# Patient Record
Sex: Male | Born: 1950 | Race: White | Hispanic: No | Marital: Married | Smoking: Former smoker
Health system: Southern US, Community
[De-identification: ages and names within clinical notes are randomized; demographics above are authoritative.]

## PROBLEM LIST (undated history)

## (undated) DIAGNOSIS — E559 Vitamin D deficiency, unspecified: Secondary | ICD-10-CM

## (undated) DIAGNOSIS — I77811 Abdominal aortic ectasia: Secondary | ICD-10-CM

## (undated) DIAGNOSIS — N529 Male erectile dysfunction, unspecified: Secondary | ICD-10-CM

## (undated) DIAGNOSIS — K759 Inflammatory liver disease, unspecified: Secondary | ICD-10-CM

## (undated) DIAGNOSIS — J449 Chronic obstructive pulmonary disease, unspecified: Secondary | ICD-10-CM

## (undated) DIAGNOSIS — Z87442 Personal history of urinary calculi: Secondary | ICD-10-CM

## (undated) DIAGNOSIS — E291 Testicular hypofunction: Secondary | ICD-10-CM

## (undated) DIAGNOSIS — E785 Hyperlipidemia, unspecified: Secondary | ICD-10-CM

## (undated) DIAGNOSIS — Z8719 Personal history of other diseases of the digestive system: Secondary | ICD-10-CM

## (undated) DIAGNOSIS — Z8619 Personal history of other infectious and parasitic diseases: Secondary | ICD-10-CM

## (undated) DIAGNOSIS — F17200 Nicotine dependence, unspecified, uncomplicated: Secondary | ICD-10-CM

## (undated) HISTORY — DX: Nicotine dependence, unspecified, uncomplicated: F17.200

## (undated) HISTORY — DX: Testicular hypofunction: E29.1

## (undated) HISTORY — DX: Male erectile dysfunction, unspecified: N52.9

## (undated) HISTORY — DX: Personal history of other diseases of the digestive system: Z87.19

## (undated) HISTORY — DX: Chronic obstructive pulmonary disease, unspecified: J44.9

## (undated) HISTORY — DX: Personal history of other infectious and parasitic diseases: Z86.19

## (undated) HISTORY — DX: Hyperlipidemia, unspecified: E78.5

## (undated) HISTORY — DX: Vitamin D deficiency, unspecified: E55.9

## (undated) HISTORY — DX: Abdominal aortic ectasia: I77.811

## (undated) HISTORY — DX: Personal history of urinary calculi: Z87.442

---

## 1982-11-07 DIAGNOSIS — K759 Inflammatory liver disease, unspecified: Secondary | ICD-10-CM

## 1982-11-07 DIAGNOSIS — Z8619 Personal history of other infectious and parasitic diseases: Secondary | ICD-10-CM

## 1982-11-07 HISTORY — DX: Inflammatory liver disease, unspecified: K75.9

## 1982-11-07 HISTORY — DX: Personal history of other infectious and parasitic diseases: Z86.19

## 1991-11-08 HISTORY — PX: OTHER SURGICAL HISTORY: SHX169

## 2001-11-07 HISTORY — PX: PILONIDAL CYST EXCISION: SHX744

## 2005-04-20 ENCOUNTER — Ambulatory Visit: Payer: Self-pay | Admitting: Vascular Surgery

## 2007-09-08 HISTORY — PX: COLONOSCOPY: SHX174

## 2007-09-08 HISTORY — PX: ESOPHAGOGASTRODUODENOSCOPY: SHX1529

## 2007-10-02 ENCOUNTER — Ambulatory Visit: Payer: Self-pay | Admitting: Gastroenterology

## 2008-11-07 HISTORY — PX: CARDIOVASCULAR STRESS TEST: SHX262

## 2009-10-20 DIAGNOSIS — Z86018 Personal history of other benign neoplasm: Secondary | ICD-10-CM

## 2009-10-20 HISTORY — DX: Personal history of other benign neoplasm: Z86.018

## 2011-10-21 LAB — LIPID PANEL
HDL: 34 mg/dL — AB (ref 35–70)
Triglycerides: 235

## 2011-10-21 LAB — HEPATIC FUNCTION PANEL
AST: 20 U/L
Alkaline Phosphatase: 118 U/L
Total Bilirubin: 0.4 mg/dL

## 2011-10-21 LAB — CBC
WBC: 7
platelet count: 233

## 2011-10-21 LAB — TESTOSTERONE: Testosterone: 303

## 2011-10-21 LAB — BASIC METABOLIC PANEL: Creat: 0.9

## 2012-01-06 HISTORY — PX: LITHOTRIPSY: SUR834

## 2012-01-20 ENCOUNTER — Inpatient Hospital Stay: Payer: Self-pay | Admitting: Student

## 2012-01-20 LAB — URINALYSIS, COMPLETE
Bacteria: NONE SEEN
Leukocyte Esterase: NEGATIVE
Protein: 100
RBC,UR: 175 /HPF (ref 0–5)
Specific Gravity: 1.038 (ref 1.003–1.030)
Squamous Epithelial: NONE SEEN
WBC UR: 8 /HPF (ref 0–5)

## 2012-01-20 LAB — CBC
HCT: 45.4 % (ref 40.0–52.0)
HGB: 15.4 g/dL (ref 13.0–18.0)
MCH: 31.7 pg (ref 26.0–34.0)
MCHC: 33.9 g/dL (ref 32.0–36.0)
MCV: 94 fL (ref 80–100)
RDW: 14.4 % (ref 11.5–14.5)

## 2012-01-20 LAB — BASIC METABOLIC PANEL
Anion Gap: 10 (ref 7–16)
Calcium, Total: 8.8 mg/dL (ref 8.5–10.1)
Co2: 25 mmol/L (ref 21–32)
EGFR (Non-African Amer.): >60
Glucose: 123 mg/dL — ABNORMAL HIGH (ref 65–99)
Osmolality: 279 (ref 275–301)
Potassium: 4.1 mmol/L (ref 3.5–5.1)
Sodium: 139 mmol/L (ref 136–145)

## 2012-01-21 ENCOUNTER — Ambulatory Visit: Payer: Self-pay | Admitting: Urology

## 2012-01-21 LAB — BASIC METABOLIC PANEL
Calcium, Total: 7.7 mg/dL — ABNORMAL LOW (ref 8.5–10.1)
Chloride: 109 mmol/L — ABNORMAL HIGH (ref 98–107)
Co2: 22 mmol/L (ref 21–32)
Creatinine: 1.37 mg/dL — ABNORMAL HIGH (ref 0.60–1.30)
EGFR (African American): >60
Osmolality: 285 (ref 275–301)
Potassium: 4.1 mmol/L (ref 3.5–5.1)
Sodium: 142 mmol/L (ref 136–145)

## 2012-01-21 LAB — CBC WITH DIFFERENTIAL/PLATELET
Basophil #: 0 10*3/uL (ref 0.0–0.1)
Basophil %: 0.2 %
Eosinophil %: 1.1 %
HCT: 39.6 % — ABNORMAL LOW (ref 40.0–52.0)
Lymphocyte #: 1.5 10*3/uL (ref 1.0–3.6)
MCH: 31.2 pg (ref 26.0–34.0)
MCV: 94 fL (ref 80–100)
Monocyte #: 1 10*3/uL — ABNORMAL HIGH (ref 0.0–0.7)
Monocyte %: 8.7 %
Neutrophil #: 9.3 10*3/uL — ABNORMAL HIGH (ref 1.4–6.5)
Platelet: 166 10*3/uL (ref 150–440)
RDW: 14.2 % (ref 11.5–14.5)
WBC: 12 10*3/uL — ABNORMAL HIGH (ref 3.8–10.6)

## 2012-01-21 LAB — MAGNESIUM: Magnesium: 1.7 mg/dL — ABNORMAL LOW

## 2012-01-22 LAB — BASIC METABOLIC PANEL
Anion Gap: 6 — ABNORMAL LOW (ref 7–16)
Calcium, Total: 7.8 mg/dL — ABNORMAL LOW (ref 8.5–10.1)
Chloride: 110 mmol/L — ABNORMAL HIGH (ref 98–107)
EGFR (African American): >60
EGFR (Non-African Amer.): >60
Glucose: 89 mg/dL (ref 65–99)
Osmolality: 280 (ref 275–301)
Potassium: 4 mmol/L (ref 3.5–5.1)

## 2012-01-26 ENCOUNTER — Ambulatory Visit: Payer: Self-pay | Admitting: Urology

## 2012-04-10 ENCOUNTER — Ambulatory Visit (INDEPENDENT_AMBULATORY_CARE_PROVIDER_SITE_OTHER): Payer: 59 | Admitting: Family Medicine

## 2012-04-10 ENCOUNTER — Encounter: Payer: Self-pay | Admitting: Family Medicine

## 2012-04-10 VITALS — BP 126/82 | HR 76 | Temp 98.4°F | Ht 68.0 in | Wt 179.2 lb

## 2012-04-10 DIAGNOSIS — F17200 Nicotine dependence, unspecified, uncomplicated: Secondary | ICD-10-CM

## 2012-04-10 DIAGNOSIS — E291 Testicular hypofunction: Secondary | ICD-10-CM | POA: Insufficient documentation

## 2012-04-10 DIAGNOSIS — Z87442 Personal history of urinary calculi: Secondary | ICD-10-CM

## 2012-04-10 DIAGNOSIS — N529 Male erectile dysfunction, unspecified: Secondary | ICD-10-CM | POA: Insufficient documentation

## 2012-04-10 MED ORDER — TADALAFIL 20 MG PO TABS: 10.0000 mg | ORAL_TABLET | Freq: Every day | ORAL | Status: DC | PRN

## 2012-04-10 NOTE — Assessment & Plan Note (Signed)
Sent in cialis again.  Has tolerated in past well.

## 2012-04-10 NOTE — Patient Instructions (Signed)
Good to meet you today, call us with questions. Return at end of year for physical, a few days prior fasting for blood work. cialis sent to pharmacy. Strongly consider cutting back if not quitting smoking - best thing you can do for your health. Let me know if worsening shortness of breath or cough.

## 2012-04-10 NOTE — Progress Notes (Signed)
Subjective:    Patient ID: Andrew Mccarthy, male    DOB: 1951/05/10, 61 y.o.   MRN: [id number]  HPI CC: new pt, establish  Prior saw Andrew Mccarthy, who retired.  Smoking - 2 ppd.  62 PY hx.  Not currently interested in quitting.  Enjoys smoking.  Works at [employer].  H/o hypogonadism - followed by Andrew Mccarthy, had received testopel but doesn't want to do pellets anymore.  Recent hematuria - normal workup.  H/o kidney stones - needed removed latest one 4mm (01/2012).  Some ED - treated with cialis in past by uro and responded well to this.  States no h/o HLD, HTN, DM.  Caffeine: 2 cups coffee/day, 4-5 mountain dew/day. Lives with wife Andrew Mccarthy) Occupation: Music therapist, maintenance with [employer] Edu: HS Activity: no regular exercise, stays active at job Diet: fruits/vegetables occasionally, lots of mountain dew  Preventative: Last CPE unsure, thinks 10/2011. Tetanus unsure, requests at next CPE. colonosocpy 2008 - told normal, rpt 10 yrs.  Done with [facility] clinic. Prostate screening - yearly.  No problems in past.  Medications and allergies reviewed and updated in chart.  Past histories reviewed and updated if relevant as below. There is no problem list on file for this patient.  Past Medical History  Diagnosis Date  . GERD (gastroesophageal reflux disease)   . 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 History of kidney stones last 2013  . Hypogonadism male     Andrew Mccarthy, uro  . Smoker   . ED (erectile dysfunction)    Past Surgical History  Procedure Date  . Kidney stone retraction 1993  . Lithotripsy 01/2012  . Esophagogastroduodenoscopy 09/2007    WNL per pt  . Colonoscopy 09/2007    WNL per pt, rpt 10 yrs  . Pilonidal cyst excision 2003   History  Substance Use Topics  . Smoking status: Current Everyday Smoker -- 2.0 packs/day for 30 years    Types: Cigarettes  . Smokeless tobacco: Never Used  . Alcohol Use: No   Family History  Problem Relation Age of Onset  .  Cancer Father 26    prostate  . Cancer Sister     breast  . Rheum arthritis Sister   . Diabetes Neg Hx   . Coronary artery disease Neg Hx   . Stroke Neg Hx    No Known Allergies Current Outpatient Prescriptions on File Prior to Visit  Medication Sig Dispense Refill  . Testosterone (TESTOPEL) 75 MG PLLT by Implant route as directed.         Review of Systems  Constitutional: Negative for fever, chills, activity change, appetite change, fatigue and unexpected weight change.  HENT: Negative for hearing loss and neck pain.   Eyes: Negative for visual disturbance.  Respiratory: Positive for cough. Negative for chest tightness, shortness of breath and wheezing.   Cardiovascular: Negative for chest pain, palpitations and leg swelling.  Gastrointestinal: Negative for nausea, vomiting, abdominal pain, diarrhea, constipation, blood in stool and abdominal distention.  Genitourinary: Negative for hematuria and difficulty urinating.  Musculoskeletal: Negative for myalgias and arthralgias.  Skin: Negative for rash.  Neurological: Negative for dizziness, seizures, syncope and headaches.  Hematological: Does not bruise/bleed easily.  Psychiatric/Behavioral: Negative for dysphoric mood. The patient is not nervous/anxious.        Objective:   Physical Exam  Nursing note and vitals reviewed. Constitutional: He is oriented to person, place, and time. He appears well-developed and well-nourished. No distress.  HENT:  Head: Normocephalic  and atraumatic.  Right Ear: Hearing, tympanic membrane, external ear and ear canal normal.  Left Ear: Hearing, tympanic membrane, external ear and ear canal normal.  Nose: Nose normal. No mucosal edema or rhinorrhea.  Mouth/Throat: Uvula is midline, oropharynx is clear and moist and mucous membranes are normal. No oropharyngeal exudate, posterior oropharyngeal edema, posterior oropharyngeal erythema or tonsillar abscesses.  Eyes: Conjunctivae and EOM are normal.  Pupils are equal, round, and reactive to light. No scleral icterus.  Neck: Normal range of motion. Neck supple. No thyromegaly present.  Cardiovascular: Normal rate, regular rhythm, normal heart sounds and intact distal pulses.   No murmur heard. Pulses:      Radial pulses are 2+ on the right side, and 2+ on the left side.  Pulmonary/Chest: Effort normal and breath sounds normal. No respiratory distress. He has no wheezes. He has no rales.  Abdominal: Soft. Bowel sounds are normal. He exhibits no distension and no mass. There is no tenderness. There is no rebound and no guarding.  Musculoskeletal: Normal range of motion. He exhibits no edema.  Lymphadenopathy:    He has no cervical adenopathy.  Neurological: He is alert and oriented to person, place, and time.       CN grossly intact, station and gait intact  Skin: Skin is warm and dry. No rash noted.  Psychiatric: He has a normal mood and affect. His behavior is normal. Judgment and thought content normal.       Assessment & Plan:

## 2012-04-10 NOTE — Assessment & Plan Note (Signed)
Followed by urology.   

## 2012-04-10 NOTE — Assessment & Plan Note (Signed)
Discussed importance of smoking cessation Discussed concern with developing COPD with such long smoking history Currently precontemplative.  Will continue to assess desire for cessation.

## 2012-09-02 ENCOUNTER — Encounter: Payer: Self-pay | Admitting: Family Medicine

## 2012-09-02 DIAGNOSIS — E559 Vitamin D deficiency, unspecified: Secondary | ICD-10-CM | POA: Insufficient documentation

## 2012-09-05 ENCOUNTER — Encounter: Payer: Self-pay | Admitting: Family Medicine

## 2012-10-15 ENCOUNTER — Ambulatory Visit (INDEPENDENT_AMBULATORY_CARE_PROVIDER_SITE_OTHER): Payer: 59 | Admitting: Family Medicine

## 2012-10-15 ENCOUNTER — Encounter: Payer: Self-pay | Admitting: Family Medicine

## 2012-10-15 VITALS — BP 100/62 | HR 60 | Temp 98.4°F | Ht 68.25 in | Wt 175.2 lb

## 2012-10-15 DIAGNOSIS — Z125 Encounter for screening for malignant neoplasm of prostate: Secondary | ICD-10-CM

## 2012-10-15 DIAGNOSIS — Z0000 Encounter for general adult medical examination without abnormal findings: Secondary | ICD-10-CM

## 2012-10-15 DIAGNOSIS — F17200 Nicotine dependence, unspecified, uncomplicated: Secondary | ICD-10-CM

## 2012-10-15 DIAGNOSIS — Z23 Encounter for immunization: Secondary | ICD-10-CM

## 2012-10-15 LAB — LIPID PANEL
HDL: 35.6 mg/dL — ABNORMAL LOW (ref >39.00)
LDL Cholesterol: 74 mg/dL (ref 0–99)
Total CHOL/HDL Ratio: 4
Triglycerides: 116 mg/dL (ref 0.0–149.0)

## 2012-10-15 LAB — BASIC METABOLIC PANEL
BUN: 15 mg/dL (ref 6–23)
CO2: 30 mEq/L (ref 19–32)
Calcium: 9.3 mg/dL (ref 8.4–10.5)
Creatinine, Ser: 1 mg/dL (ref 0.4–1.5)

## 2012-10-15 NOTE — Progress Notes (Signed)
Subjective:    Patient ID: Andrew Mccarthy, male    DOB: 09/07/1951, 61 y.o.   MRN: [id number]  HPI CC: annual exam  Has not returned to see Dr. Evelene Croon for h/o hypogonadism.  Caffeine: 2 cups coffee/day, 4-5 mountain dew/day.  Lives with wife [name])  Occupation: Music therapist, maintenance with [employer]  Edu: HS  Activity: no regular exercise, stays active at job  Diet: fruits/vegetables occasionally, lots of mountain dew, seldom water  Preventative:  Flu shot - declines. Tetanus today (Tdap). colonoscopy 2008 - told normal, rpt 10 yrs. Done with [facility].  Will request records today. Prostate screening - yearly. No problems in past.    Seat belt use discussed.  Does not wear. Sunscreen use discussed.  Denies anhedonia, depression, sadness.  Works 7d/wk 12 hr shifts.  Planning on retiring at home.  Off work this week. Smoking - 2ppd.  Contemplating cutting back.  Bought e cig, trouble getting it to work.  Battery dies quickly.    Medications and allergies reviewed and updated in chart.  Past histories reviewed and updated if relevant as below. Patient Active Problem List  Diagnosis  . History of kidney stones  . Hypogonadism male  . Smoker  . ED (erectile dysfunction)  . Vitamin D deficiency  . Healthcare maintenance   Past Medical History  Diagnosis Date  . History of gastritis     not currently an issue  . 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 History of kidney stones last 2013  . Hypogonadism male     Dr. Evelene Croon, uro prior on testopel  . Smoker   . ED (erectile dysfunction)   . Vitamin D deficiency    Past Surgical History  Procedure Date  . Kidney stone retraction 1993  . Lithotripsy 01/2012  . Esophagogastroduodenoscopy 09/2007    WNL per pt  . Colonoscopy 09/2007    WNL per pt, rpt 10 yrs  . Pilonidal cyst excision 2003  . Cardiovascular stress test 2010    myoview, WNL   History  Substance Use Topics  . Smoking status: Current Every Day  Smoker -- 2.0 packs/day for 30 years    Types: Cigarettes  . Smokeless tobacco: Never Used  . Alcohol Use: No   Family History  Problem Relation Age of Onset  . Cancer Father 81    prostate  . Cancer Sister     breast  . Rheum arthritis Sister   . Cancer Son 82    colon  . Coronary artery disease Neg Hx   . Stroke Neg Hx   . Diabetes Neg Hx    No Known Allergies Current Outpatient Prescriptions on File Prior to Visit  Medication Sig Dispense Refill  . aspirin 81 MG tablet Take 81 mg by mouth daily.      . cholecalciferol (VITAMIN D) 1000 UNITS tablet Take 1,000 Units by mouth daily.      . Multiple Vitamin (MULTIVITAMIN) tablet Take 1 tablet by mouth daily.      . vitamin C (ASCORBIC ACID) 500 MG tablet Take 500 mg by mouth daily.      . tadalafil (CIALIS) 20 MG tablet Take 0.5-1 tablets (10-20 mg total) by mouth daily as needed for erectile dysfunction.  10 tablet  0     Review of Systems  Constitutional: Negative for fever, chills, activity change, appetite change, fatigue and unexpected weight change.  HENT: Negative for hearing loss and neck pain.   Eyes: Negative for  visual disturbance.  Respiratory: Positive for cough and shortness of breath (with exertion, attributes to smoker). Negative for chest tightness and wheezing.   Cardiovascular: Negative for chest pain, palpitations and leg swelling.  Gastrointestinal: Negative for nausea, vomiting, abdominal pain, diarrhea, constipation, blood in stool and abdominal distention.  Genitourinary: Negative for hematuria and difficulty urinating.  Musculoskeletal: Negative for myalgias and arthralgias.  Skin: Negative for rash.  Neurological: Negative for dizziness, seizures, syncope and headaches.  Hematological: Does not bruise/bleed easily.  Psychiatric/Behavioral: Negative for dysphoric mood. The patient is not nervous/anxious.        Objective:   Physical Exam  Nursing note and vitals reviewed. Constitutional: He is  oriented to person, place, and time. He appears well-developed and well-nourished. No distress.  HENT:  Head: Normocephalic and atraumatic.  Right Ear: Hearing, tympanic membrane, external ear and ear canal normal.  Left Ear: Hearing, tympanic membrane, external ear and ear canal normal.  Nose: Nose normal.  Mouth/Throat: Oropharynx is clear and moist. No oropharyngeal exudate.  Eyes: Conjunctivae normal and EOM are normal. Pupils are equal, round, and reactive to light. No scleral icterus.  Neck: Normal range of motion. Neck supple. No thyromegaly present.  Cardiovascular: Normal rate, regular rhythm, normal heart sounds and intact distal pulses.   No murmur heard. Pulses:      Radial pulses are 2+ on the right side, and 2+ on the left side.  Pulmonary/Chest: Effort normal and breath sounds normal. No respiratory distress. He has no wheezes. He has no rales.  Abdominal: Soft. Bowel sounds are normal. He exhibits no distension and no mass. There is no tenderness. There is no rebound and no guarding.  Genitourinary: Rectum normal and prostate normal. Rectal exam shows no external hemorrhoid, no internal hemorrhoid, no fissure, no mass, no tenderness and anal tone normal. Prostate is not enlarged (15-20gm) and not tender.  Musculoskeletal: Normal range of motion. He exhibits no edema.  Lymphadenopathy:    He has no cervical adenopathy.  Neurological: He is alert and oriented to person, place, and time.       CN grossly intact, station and gait intact  Skin: Skin is warm and dry. No rash noted.  Psychiatric: He has a normal mood and affect. His behavior is normal. Judgment and thought content normal.       Assessment & Plan:

## 2012-10-15 NOTE — Patient Instructions (Signed)
Tdap today. Sign release of information for records from [facility] for colonoscopy ~2008. Blood work today.

## 2012-10-15 NOTE — Addendum Note (Signed)
Addended by: Sydell Axon C on: 10/15/2012 09:57 AM   Modules accepted: Orders

## 2012-10-15 NOTE — Assessment & Plan Note (Addendum)
Preventative protocols reviewed and updated unless pt declined. Discussed healthy diet and lifestyle.  rec more water, less mt dew, more fruits/vegetables. PSA today.  DRE reassuring. utd colonoscopy. Tdap today.

## 2012-10-15 NOTE — Assessment & Plan Note (Signed)
Encouraged cessation.  Discussed different methods to help quit.  Pt will start with e cig, notify me if interested in further assistance ie chantix.

## 2012-10-16 ENCOUNTER — Encounter: Payer: Self-pay | Admitting: *Deleted

## 2012-10-28 ENCOUNTER — Encounter: Payer: Self-pay | Admitting: Family Medicine

## 2013-06-10 ENCOUNTER — Other Ambulatory Visit: Payer: Self-pay | Admitting: Orthopedic Surgery

## 2013-06-14 LAB — MISC AER/ANAEROBIC CULT.

## 2014-07-04 DIAGNOSIS — R0789 Other chest pain: Secondary | ICD-10-CM | POA: Insufficient documentation

## 2014-09-08 ENCOUNTER — Ambulatory Visit: Payer: Self-pay | Admitting: Nurse Practitioner

## 2014-10-20 ENCOUNTER — Ambulatory Visit: Payer: Self-pay | Admitting: Orthopedic Surgery

## 2014-10-24 ENCOUNTER — Ambulatory Visit: Payer: Self-pay | Admitting: Orthopedic Surgery

## 2014-10-28 LAB — WOUND CULTURE

## 2015-02-28 NOTE — Op Note (Signed)
PATIENT NAME:  Andrew Mccarthy, Andrew Mccarthy MR#:  [id number] DATE OF BIRTH:  [birth year]  DATE OF PROCEDURE:  10/24/2014  PREOPERATIVE DIAGNOSIS: Right chronic infected prepatellar bursitis.   POSTOPERATIVE DIAGNOSIS: Right chronic infected prepatellar bursitis.   PROCEDURE: Excision of right chronic prepatellar bursitis.   ANESTHESIA: General.   SURGEON: Laurene Footman, MD   DESCRIPTION OF PROCEDURE: The patient was brought to the operating room and after adequate anesthesia was obtained, the right leg was prepped and draped in the usual sterile fashion with a tourniquet applied to the upper thigh. After patient identification and timeout procedures were completed, the tourniquet was raised to 300 mmHg. Over the patella and proximal patellar tendon, there were 2 draining sinuses. These were elliptically excised longitudinally and sent as a specimen. There was extensive prepatellar bursitis present, and thickened tissue. This was debrided off the patellar tendon and patella to get down to fresh healthy tissue. After the debridement had been completed, culture was obtained. The wound was thoroughly irrigated and then closed with staples with 0.5% Sensorcaine infiltrated around the incision to aid in postoperative analgesia. A compressive dressing of Xeroform, 4 x 4's, ABD, Webril and Ace wrap was applied. The patient was sent to the recovery room in stable condition.   ESTIMATED BLOOD LOSS: Minimal. After initial debridement the tourniquet was let down to a medium to check hemostasis.  TOTAL TOURNIQUET TIME: 6 minutes at 300 mmHg.   SPECIMEN: Excised skin with chronic draining sinus along with culture, subcutaneous tissue.   ____________________________ Laurene Footman, MD mjm:ST D: 10/24/2014 19:34:01 ET T: 10/25/2014 02:17:38 ET JOB#: [id number]  cc: Laurene Footman, MD, <Dictator> Laurene Footman MD ELECTRONICALLY SIGNED 10/25/2014 6:53

## 2015-03-01 NOTE — Discharge Summary (Signed)
PATIENT NAME:  Andrew Mccarthy, Andrew Mccarthy MR#:  [id number] DATE OF BIRTH:  1951/03/15  DATE OF ADMISSION:  01/20/2012 DATE OF DISCHARGE:  01/22/2012  CHIEF COMPLAINT: Right flank pain, nausea, vomiting.   DISCHARGE DIAGNOSES:  1. Nephrolithiasis. 2. Nausea, vomiting secondary to nephrolithiasis probably.  3. Acute renal failure, resolved.  4. Leukocytosis likely reactive.   DISCHARGE MEDICATIONS:  1. Percocet 5/325, 1 to 2 tabs p.o. every four hours as needed for pain.  2. Flomax 0.4 mg daily until stone is passed.   DISPOSITION: Home.   CONSULTANTS: Dr. Maudie Mercury from Urology.   DIET: Low sodium.   ACTIVITY: As tolerated.   FOLLOWUP: Please follow up with the urologist within 1 to 2 weeks.   HISTORY OF PRESENT ILLNESS: Please see full history and physical dictated on 01/20/2012, but briefly this is a 64 year old male who came in with right flank pain and nausea/vomiting. He had some p.o. intolerance and was admitted to hospitalist service for renal colic. Furthermore, he did have mild renal failure and mild leukocytosis.   SIGNIFICANT LABS: Initial creatinine is 1.14, increased to 1.37, by discharge is 1.03. Troponin negative x1, initial WBC 13.1, which trended down to 12. Urine culture no growth to date. Urinalysis: 100 protein, 3+ blood, 175, RBC, eight WBC, no bacteria. CT abdomen with pelvis without contrast showing a 4 mm proximal right ureteral calculus resulting in mild right hydronephrosis and right perinephritic stranding.   HOSPITAL COURSE: The patient was admitted to the hospital with initiation of IV fluids, p.r.n. antiemetics and pain medicines. The patient was seen by Urology. Flomax was recommended. As the stone is small, it was thought that this was spontaneously pass. His urine was strained, however, we have not seen the stone yet. Nonetheless, he has no further pain or nausea, vomiting. His renal function is back to normal. He had mild leukocytosis, however, that is likely reactive and  his urine cultures are negative. Initially he was started on antibiotic, however, that has been since discontinued. He has no fever either. He will be discharged with outpatient follow-up with his primary urologist, Dr. Rogers Blocker. He is to continue Flomax and p.r.n. pain medicines.   DISPOSITION: Home.   TOTAL TIME SPENT: 35 minutes.   CODE STATUS: The patient is full code.    ____________________________ Vivien Presto, MD sa:tbf D: 01/22/2012 13:04:20 ET T: 01/24/2012 10:28:08 ET JOB#: [id number]  cc: Vivien Presto, MD, <Dictator> Otelia Limes. Yves Dill, MD Vivien Presto MD ELECTRONICALLY SIGNED 01/27/2012 18:41

## 2015-03-01 NOTE — Consult Note (Signed)
Brief Urology Consultation Report: For Consultation: Right Ureteral StonePhysician: Cherre Huger, M.D.Physician: Darcella Cheshire, M.D. Right Ureteral Calculus - at 35mm, the pt has a >90% likelihood of spontaneous stone passage. Indications for intervention were reviewed at length with the pt (infection, refractory colic, inability to tolerate po's). The pt requests a trial of passage. Trial of passage with f/u with Dr. Maryan Puls as an outpt. Tamsulosin 0.4mg , 1 po qDay 30" after the same meal (medical expulsive stone therapy)Percocet (5/325), 1-2 po q4hrs prn pain or Dilaudid 2-4 mg po q4hrs prn painZofran 4mg  po q4hrs prn nauseaStrain all urineCall Dr. Pamala Duffel on Monday morning to schedule follow up. (Pt understands the need to follow up along with the 2% incidence of silent obstruction with the potential loss of the affected renal unit over time). you for the opportunity to participate in the care of this most pleasant gentleman.    Electronic Signatures: Darcella Cheshire (MD)  (Signed on 16-Mar-13 13:53)  Authored  Last Updated: 16-Mar-13 13:53 by Darcella Cheshire (MD)

## 2015-03-01 NOTE — H&P (Signed)
PATIENT NAME:  Andrew Mccarthy, Andrew Mccarthy MR#:  [id number] DATE OF BIRTH:  [birth year]  DATE OF ADMISSION:  01/20/2012  PRIMARY CARE PHYSICIAN:  Dr. Jacqualine Code.  UROLOGIST: Dr. Yves Dill.  REFERRING PHYSICIAN: ER physician, Dr. Reita Cliche.    CHIEF COMPLAINT: Right flank pain, nausea, vomiting.   HISTORY OF PRESENT ILLNESS: The patient is a 64 year old male with no significant past medical history other than history of recurrent kidney stones. He has seen Dr. Yves Dill in the past. He presents with sudden onset of right flank pain associated with nausea and vomiting. The patient was given a trial of oral pain and nausea medications and some food, however, he continued to have severe nausea and vomiting and is being admitted to the hospital service for renal colic.   PAST MEDICAL HISTORY: History of kidney stones.   PAST SURGICAL HISTORY:  1. Pilonidal cyst.  2. History of cystoscopy with stone removal in 1985.   MEDICATIONS: None.   SOCIAL HISTORY: Smokes 2 packs per day. No alcohol or drug abuse. He is married and works in a [employer]. He is married, lives with his wife.   FAMILY HISTORY:  Mother had dementia. Father had metastatic prostate cancer.   REVIEW OF SYSTEMS: CONSTITUTIONAL: Denies any fever. Reports fatigue and weakness. EYES: Denies any blurred or double vision. ENT: Denies any tinnitus or ear pain. RESPIRATORY: Denies any cough or wheezing. CARDIOVASCULAR: Denies any palpitations or chest pain. GASTROINTESTINAL: Reports nausea and vomiting. GU: Reports renal colic, right flank pain. ENDOCRINE: Denies any polyuria or nocturia. HEME/LYMPH: Denies any anemia or easy bruisability. INTEGUMENT: Denies any acne or rash. MUSCULOSKELETAL: Denies any swelling or gout. NEUROLOGICAL: Denies any numbness or weakness. PSYCH: Denies any anxiety or depression.   PHYSICAL EXAMINATION:    GENERAL: The patient looks extremely fatigued, not in acute distress.   VITAL SIGNS: Temperature 97.6, heart rate 50, respiratory  rate 24, blood pressure 136/74, pulse 98% on room air.   HEENT: Head atraumatic, normocephalic.   EYES: No pallor, icterus, or cyanosis. Pupils equal, round, and reactive to light and accommodation. Extraocular movements intact.   ENT: Wet mucous membranes. No oropharyngeal erythema or thrush.   NECK: Supple. No masses. No JVD. No thyromegaly or lymphadenopathy.   CHEST WALL: No tenderness to palpation. Not using accessory muscles of respiration. No intercostal muscle retractions.   LUNGS: Bilaterally clear to auscultation. No wheezing, rales or rhonchi.   CARDIOVASCULAR: S1, S2 regular. No murmur, rubs, or gallops.   ABDOMEN: Soft. No guarding. No rigidity. Hypoactive bowel sounds. There is tenderness to palpation in the right flank region.   SKIN: No rashes or lesions.   PERIPHERY: No pedal edema; 2+ pedal pulses.   MUSCULOSKELETAL: No cyanosis or clubbing.   NEUROLOGICAL: Awake, alert, oriented x3. Nonfocal neurological exam. Cranial nerves grossly intact.   PSYCH: Normal mood and affect.   LABORATORY, DIAGNOSTIC, AND RADIOLOGICAL DATA: CT of the abdomen shows proximal 4 mm right ureteral stone with mild right hydronephrosis and right perinephric stranding. Urinalysis shows 175 RBCs, 3+ blood. White count 13.1, hemoglobin 15.4, hematocrit 45.4, platelets 208. Glucose 123. The rest of LFTs are normal.   ASSESSMENT AND PLAN: A 64 year old male with past medical history of kidney stones and smoking who presents with right flank pain, nausea and vomiting. He was in severe distress and received multiple doses of antiemetics and analgesics. He was given a trial of oral pills and diet. However, he failed that.  1. Renal colic. The patient has a right 4 mm  ureteral stone. Will admit the patient for symptomatic kidney stone. Start him on IV fluid hydration. We will strain his urine to see if he passes any  stone with IV fluid hydration. We will place on p.r.n. analgesic antiemetics. We will  get a urine culture and start him on empiric antibiotics as there was some perinephric stranding seen on CT scan. We will get a urology consultation.  2. Leukocytosis, likely reactive. We will monitor closely.  3. Hyperglycemia, likely due to acute stress. Will monitor closely.  4. History of smoking. The patient was extensively counseled about smoking for more than three minutes. Will provide with a nicotine patch.  5. We will place on gastrointestinal and deep venous thrombosis prophylaxis.   Discussed with the ED physician, discussed with the patient and his wife the plan of care and management.   TIME SPENT: 75 minutes.  ____________________________ Cherre Huger, MD sp:ap D: 01/20/2012 20:54:48 ET T: 01/21/2012 08:37:22 ET JOB#: [id number]  cc: Cherre Huger, MD, <Dictator> Milinda Pointer. Jacqualine Code, MD Cherre Huger MD ELECTRONICALLY SIGNED 01/23/2012 7:36

## 2015-03-01 NOTE — Consult Note (Signed)
PATIENT NAME:  Andrew Mccarthy, Andrew Mccarthy MR#:  [id number] DATE OF BIRTH:  Nov 30, 1950  DATE OF CONSULTATION:  01/21/2012  REFERRING PHYSICIAN:  Cherre Huger, MD CONSULTING PHYSICIAN:  Darcella Cheshire, MD  PRIMARY CARE PHYSICIAN: Debbora Dus, MD  REASON FOR CONSULTATION: Right proximal ureteral stone.   IDENTIFICATION: The patient is a 64 year old white male with a history of recurrent urolithiasis who was admitted on 01/20/2012 with a right ureteral stone with refractory colic and an inability to tolerate POs.  HISTORY OF PRESENT ILLNESS: The patient was in his usual state of good health until 11 o'clock in the morning, on 01/20/2012, when he developed the acute onset of severe stabbing pain in the right flank (10/10). The patient reports the pain as being constant and unrelenting. The patient presented to the [facility] Emergency Department. Intravenous pain medications were administered in the Emergency Department with the rapid attainment of good pain control, per the patient. Evaluation in the Emergency Department revealed microscopic hematuria with 175 red blood cells per high power field, a mild elevation in the white blood cell count at 13.1 thousand, and normal creatinine at 1.14 with a normal calcium level of 8.8. The patient was afebrile with stable vital signs. A stone protocol CT scan of the abdomen and pelvis was obtained which revealed a 4 mm right proximal ureteral stone with mild right hydronephrosis and mild right perinephric stranding. After good pain control had been achieved with intravenous pain medications, an attempt at maintaining pain control with oral pain medicines was unsuccessful due to nausea and vomiting. As such, the patient was admitted to the hospitalist service for intravenous hydration, pain control, and a trial of passage. At the time of the evaluation, on 01/21/2012, at approximately 1 o'clock in the afternoon, the patient denied the need for any further pain  medicine overnight. He did develop some recurrent pain in the right flank at approximately 8 o'clock this morning which he rated at a 4/10. He was administered oral pain medicines which he tolerated without nausea and this resulted in approximately 50% reduction in the degree of pain from 4/10 to 2/10. The patient was able to eat ice cream and tolerate liquids this morning as well.   URINARY TRACT STONE HISTORY IN DETAIL: The patient reports requiring what sounds like ureteroscopy in the 1980s by Dr. Maryan Puls for the extraction of an ureteral stone. The patient reports an episode of colic approximately three years ago which resolved without any evident passage of the stone. The patient reports microscopic hematuria being detected by his primary care doctor in late December 2012. He was referred to Dr. Maryan Puls for evaluation of the microscopic hematuria. Per the patient, what sounds like an intravenous pyelogram was performed which did not reveal any stones or abnormalities, again per the patient. The patient also reports that he underwent cystourethroscopy which again was without abnormality, per the patient. The patient reports that he was told that he may have had a small stone that passed as an explanation for his microscopic hematuria.   PAST MEDICAL HISTORY: The patient denies any significant past medical history. Specifically, he denies any history of coronary artery disease, hypertension, diabetes mellitus, asthma, or chronic obstructive pulmonary disease.   PAST SURGICAL HISTORY:  1. Status post ureteroscopy with stone extraction in the 1980s.  2. Status post pilonidal cyst excision approximately 10 years ago.  ALLERGIES OR DRUG SENSITIVITIES: The patient denies any medication allergies or sensitivities.   MEDICATIONS:  1. Multivitamin.  2. Vitamin D 2000 international units p.o. daily. 3. Vitamin C, question unknown dose, (?) 500 mg p.o. daily. 4. Vitamin B complex. 5. Baby  aspirin one p.o. daily.   SOCIAL HISTORY:  HABITS:  1. Tobacco. The patient reports a 2 pack per day smoking history for the past 30 years. The patient was encouraged to stop smoking.  2. Alcohol. The patient reports only rare alcohol use and denies any history of dependence.   FAMILY HISTORY: The patient denies any family history of urolithiasis.    REVIEW OF SYSTEMS: CONSTITUTIONAL: The patient denies any fevers or chills. HEENT: The patient denies any recent upper respiratory infections. RESPIRATORY: The patient denies any shortness of breath. CARDIOVASCULAR: The patient reports an episode of mild substernal chest pain approximately five days ago at night while at rest that lasted for approximately 10 to 15 minutes. The patient was advised to seek medical attention should he experience a similar recurrent episode. GASTROINTESTINAL: The patient reports nausea and vomiting with his ureteral colic yesterday, but none since reasonable pain control has been obtained and was able to tolerate food and liquids this morning. GENITOURINARY: As per the History of Present Illness.  In addition, the patient denies any dysuria or gross hematuria. HEMATOLOGIC/LYMPHATIC: The patient denies any adenopathy or abnormal bleeding. INTEGUMENT: The patient denies any skin rashes or lesions.  MUSCULOSKELETAL: The patient denies any unusual muscle aches or joint pains. NEUROLOGIC: The patient denies any lateralizing weakness. PSYCHIATRIC: The patient denies any anxiety or depression   PHYSICAL EXAMINATION:   VITAL SIGNS: Temperature 97.6 degrees Fahrenheit, pulse 54 and regular, respirations 18 and unlabored, blood pressure 113/66, pulse oximetry 97% on 2 liters. These were as of 05:26 this morning.   GENERAL: Well-developed, well-nourished white male in no apparent distress.   HEENT: Normocephalic, atraumatic. Extraocular movements intact. Anicteric.   NECK: No masses, no bruits.   CHEST: Clear to auscultation.  Normal respiratory effort.   CARDIOVASCULAR: Regular rate and rhythm without murmurs, gallops, or rubs with 2+ radial pulses bilaterally and 1+ carotid pulses bilaterally.  ABDOMEN: Soft, nontender, and nondistended. Normal active bowel sounds. No costovertebral angle tenderness.   SKIN: No rashes or lesions about the head, face, neck, and upper extremities.   EXTREMITIES: No peripheral edema.   MUSCULOSKELETAL: Normal range of motion.   NEUROLOGIC: Nonfocal.  PSYCHIATRIC: Alert and oriented x4, pleasant and cooperative.   LABS/STUDIES: Creatinine 1.37, calcium 7.7, magnesium 1.7, potassium 4.1, sodium 142, chloride 109, CO2 22, BUN 17, glucose 99. White blood cell count 12,000, hematocrit 39.6, platelet count 166,000   ASSESSMENT: Right proximal ureteral calculus - at 4 mm, the patient has a greater than 90% probability for successful spontaneous stone passage. The indications for intervention were reviewed at length with the patient which included concerns for infection, difficulty with pain control, and difficulty tolerating PO's. As the patient is experiencing none of these and is currently with well controlled colic, he requests a trial of passage at home. The potential risks and benefits of alpha blockade to facilitate stone passage were reviewed with the patient who wishes to proceed with alpha blockade for the trial stone passage as well.   RECOMMENDATIONS:  1. Trial of stone passage with tamsulosin 0.4 mg one p.o. daily 30 minutes after the same meal.  2. Percocet 5/325 mg 1 to 2 p.o. every 4 hours p.r.n. pain or Dilaudid 2 to 4 mg p.o. every 4 hours p.r.n. pain, and Zofran 4 mg p.o. every 4 hours p.r.n. nausea.  3. The patient was advised to inform Dr. Maryan Puls of the events of this weekend on Monday so appropriate follow-up could be arranged.  ____________________________ Darcella Cheshire, MD jhk:[id number] D: 01/21/2012 13:32:00 ET T: 01/21/2012 14:04:26 ET JOB#: [id number]  cc: Darcella Cheshire, MD, <Dictator> Darcella Cheshire MD ELECTRONICALLY SIGNED 01/22/2012 12:12

## 2015-03-02 LAB — SURGICAL PATHOLOGY

## 2015-12-09 ENCOUNTER — Encounter: Payer: Self-pay | Admitting: Family Medicine

## 2015-12-09 ENCOUNTER — Ambulatory Visit (INDEPENDENT_AMBULATORY_CARE_PROVIDER_SITE_OTHER): Payer: Commercial Managed Care - HMO | Admitting: Family Medicine

## 2015-12-09 VITALS — BP 130/71 | HR 100 | Temp 97.6°F | Resp 20 | Ht 70.0 in | Wt 193.2 lb

## 2015-12-09 DIAGNOSIS — Z7189 Other specified counseling: Secondary | ICD-10-CM | POA: Diagnosis not present

## 2015-12-09 DIAGNOSIS — J449 Chronic obstructive pulmonary disease, unspecified: Secondary | ICD-10-CM | POA: Diagnosis not present

## 2015-12-09 DIAGNOSIS — Z7689 Persons encountering health services in other specified circumstances: Secondary | ICD-10-CM

## 2015-12-09 NOTE — Progress Notes (Signed)
Name: Andrew Mccarthy   MRN: [id number]    DOB: [birth year]   Date:12/09/2015       Progress Note  Subjective  Chief Complaint  Chief Complaint  Patient presents with  . Establish Care    NP    HPI  Pt. Is here to establish care. Former pt. of Dr. Jacqualine Mccarthy. Records reviewed in detail.  Past Medical History  Diagnosis Date  . History of gastritis     not currently an issue  . 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 History of kidney stones last 2013  . Hypogonadism male   . Smoker   . ED (erectile dysfunction)   . Vitamin D deficiency   . Hyperlipidemia     Past Surgical History  Procedure Laterality Date  . Kidney stone retraction  1993  . Lithotripsy  01/2012  . Esophagogastroduodenoscopy  09/2007    WNL,   . Colonoscopy  09/2007    WNL, rpt 10 yrs Andrew Mccarthy) Andrew Mccarthy  . Pilonidal cyst excision  2003  . Cardiovascular stress test  2010    myoview, WNL    Family History  Problem Relation Age of Onset  . Cancer Father 65    prostate  . Cancer Sister     breast  . Rheum arthritis Sister   . Cancer Son 34    colon  . Coronary artery disease Neg Hx   . Stroke Neg Hx   . Diabetes Neg Hx   . Alzheimer's disease Mother     Social History   Social History  . Marital Status: Married    Spouse Name: N/A  . Number of Children: N/A  . Years of Education: N/A   Occupational History  . Not on file.   Social History Main Topics  . Smoking status: Former Smoker -- 2.00 packs/day for 30 years    Types: Cigarettes    Quit date: 09/21/2015  . Smokeless tobacco: Never Used  . Alcohol Use: No  . Drug Use: No  . Sexual Activity: No   Other Topics Concern  . Not on file   Social History Narrative   Caffeine: 2 cups coffee/day, 4-5 mountain dew/day.   Lives with wife Andrew Mccarthy)   Occupation: Games developer, maintenance with [employer]   Edu: HS   Activity: no regular exercise, stays active at job   Diet: fruits/vegetables occasionally, lots of mountain dew     Current outpatient prescriptions:  .  cholecalciferol (VITAMIN D) 1000 UNITS tablet, Take 1,000 Units by mouth daily., Disp: , Rfl:  .  Fluticasone-Salmeterol (ADVAIR) 250-50 MCG/DOSE AEPB, Inhale 1 puff into the lungs 2 (two) times daily., Disp: , Rfl:   No Known Allergies   ROS   Objective  Filed Vitals:   12/09/15 1505  BP: 130/71  Pulse: 100  Temp: 97.6 F (36.4 C)  TempSrc: Oral  Resp: 20  Height: 5\' 10"  (1.778 m)  Weight: 193 lb 3.2 oz (87.635 kg)  SpO2: 96%    Physical Exam  Constitutional: He is oriented to person, place, and time and well-developed, well-nourished, and in no distress.  Cardiovascular: Normal rate, regular rhythm and normal heart sounds.   Pulmonary/Chest: Effort normal and breath sounds normal.  Neurological: He is alert and oriented to person, place, and time.  Nursing note and vitals reviewed.    Assessment & Plan  1. Encounter to establish care with new doctor Records from Dr. Jacqualine Mccarthy reviewed in detail. Will schedule for a complete physical exam.  2. Chronic obstructive pulmonary disease, unspecified COPD type ([place])    Andrew Mccarthy [facility] 12/09/2015 3:27 PM

## 2015-12-30 ENCOUNTER — Encounter: Payer: Commercial Managed Care - HMO | Admitting: Family Medicine

## 2015-12-31 ENCOUNTER — Ambulatory Visit (INDEPENDENT_AMBULATORY_CARE_PROVIDER_SITE_OTHER): Payer: Commercial Managed Care - HMO | Admitting: Family Medicine

## 2015-12-31 ENCOUNTER — Encounter: Payer: Self-pay | Admitting: Family Medicine

## 2015-12-31 VITALS — BP 132/68 | HR 85 | Temp 97.8°F | Resp 18 | Ht 70.0 in | Wt 197.6 lb

## 2015-12-31 DIAGNOSIS — J449 Chronic obstructive pulmonary disease, unspecified: Secondary | ICD-10-CM

## 2015-12-31 DIAGNOSIS — R7989 Other specified abnormal findings of blood chemistry: Secondary | ICD-10-CM | POA: Insufficient documentation

## 2015-12-31 DIAGNOSIS — Z23 Encounter for immunization: Secondary | ICD-10-CM | POA: Diagnosis not present

## 2015-12-31 DIAGNOSIS — R220 Localized swelling, mass and lump, head: Secondary | ICD-10-CM

## 2015-12-31 DIAGNOSIS — Z0000 Encounter for general adult medical examination without abnormal findings: Secondary | ICD-10-CM | POA: Diagnosis not present

## 2015-12-31 DIAGNOSIS — R001 Bradycardia, unspecified: Secondary | ICD-10-CM | POA: Diagnosis not present

## 2015-12-31 DIAGNOSIS — J3489 Other specified disorders of nose and nasal sinuses: Secondary | ICD-10-CM | POA: Insufficient documentation

## 2015-12-31 MED ORDER — ALBUTEROL SULFATE HFA 108 (90 BASE) MCG/ACT IN AERS
2.0000 | INHALATION_SPRAY | Freq: Four times a day (QID) | RESPIRATORY_TRACT | Status: DC | PRN
Start: 2015-12-31 — End: 2019-10-10

## 2015-12-31 NOTE — Progress Notes (Signed)
Name: Andrew Mccarthy   MRN: [id number]    DOB: [birth year]   Date:12/31/2015       Progress Note  Subjective  Chief Complaint  Chief Complaint  Patient presents with  . Annual Exam    CPE    HPI  Pt. Is here for a Complete Physical Exam. Last colonoscopy was 8 years ago, was told to 'come back in 10 years'. No records available. Last PSA in December 2014, was normal. Last EKG was in August 2015.   Past Medical History  Diagnosis Date  . History of gastritis     not currently an issue  . 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 History of kidney stones last 2013  . Hypogonadism male   . Smoker   . ED (erectile dysfunction)   . Vitamin D deficiency   . Hyperlipidemia   . COPD (chronic obstructive pulmonary disease) [facility])     Past Surgical History  Procedure Laterality Date  . Kidney stone retraction  1993  . Lithotripsy  01/2012  . Esophagogastroduodenoscopy  09/2007    WNL,   . Colonoscopy  09/2007    WNL, rpt 10 yrs [name]) [place]  . Pilonidal cyst excision  2003  . Cardiovascular stress test  2010    myoview, WNL    Family History  Problem Relation Age of Onset  . Cancer Father 45    prostate  . Cancer Sister     breast  . Rheum arthritis Sister   . Cancer Son 59    colon  . Coronary artery disease Neg Hx   . Stroke Neg Hx   . Diabetes Neg Hx   . Alzheimer's disease Mother     Social History   Social History  . Marital Status: Married    Spouse Name: N/A  . Number of Children: N/A  . Years of Education: N/A   Occupational History  . Not on file.   Social History Main Topics  . Smoking status: Former Smoker -- 2.00 packs/day for 30 years    Types: Cigarettes    Quit date: 09/21/2015  . Smokeless tobacco: Never Used  . Alcohol Use: No  . Drug Use: No  . Sexual Activity: No   Other Topics Concern  . Not on file   Social History Narrative   Caffeine: 2 cups coffee/day, 4-5 mountain dew/day.   Lives with wife [name])   Occupation:  Games developer, maintenance with [employer]   Edu: HS   Activity: no regular exercise, stays active at job   Diet: fruits/vegetables occasionally, lots of mountain dew     Current outpatient prescriptions:  .  cholecalciferol (VITAMIN D) 1000 UNITS tablet, Take 1,000 Units by mouth daily., Disp: , Rfl:  .  fluticasone (FLONASE) 50 MCG/ACT nasal spray, USE 2 SPRAYS EACH NOSTRIL EVERY DAY, Disp: , Rfl: 3 .  Fluticasone-Salmeterol (ADVAIR) 250-50 MCG/DOSE AEPB, Inhale 1 puff into the lungs 2 (two) times daily., Disp: , Rfl:   No Known Allergies   Review of Systems  Constitutional: Negative for fever and chills.  Eyes: Negative for blurred vision.  Respiratory: Positive for shortness of breath (Pt. has COPD, has shortness of breath every day.). Negative for cough.   Cardiovascular: Negative for chest pain and leg swelling.  Gastrointestinal: Positive for heartburn. Negative for nausea, vomiting, abdominal pain, blood in stool and melena.  Genitourinary: Negative for dysuria and hematuria.  Musculoskeletal: Positive for joint pain. Negative for back pain.  Skin: Negative  for rash.  Neurological: Negative for headaches.  Psychiatric/Behavioral: Negative for depression. The patient is not nervous/anxious.     Objective  Filed Vitals:   12/31/15 0858  BP: 132/68  Pulse: 85  Temp: 97.8 F (36.6 C)  TempSrc: Oral  Resp: 18  Height: 5\' 10"  (1.778 m)  Weight: 197 lb 9.6 oz (89.631 kg)  SpO2: 96%    Physical Exam  Constitutional: He is oriented to person, place, and time and well-developed, well-nourished, and in no distress.  HENT:  Head: Normocephalic.  Right Ear: Tympanic membrane and ear canal normal.  Left Ear: Tympanic membrane and ear canal normal.  Mouth/Throat: No posterior oropharyngeal erythema.  Round, pink-colored mass visualized at the opening of left nares. Turbinates inflamed,  Eyes: Pupils are equal, round, and reactive to light.  Cardiovascular: Normal rate, regular  rhythm, S1 normal and S2 normal.   Pulmonary/Chest: Effort normal. He has wheezes.  Faint expiratory wheezes in left lung field.  Abdominal: Soft. Bowel sounds are normal. There is no tenderness.  Genitourinary: Rectum normal and prostate normal. Rectal exam shows no external hemorrhoid. Prostate is not tender.  Musculoskeletal:       Right ankle: He exhibits no swelling.       Left ankle: He exhibits no swelling.  Neurological: He is alert and oriented to person, place, and time.  Skin: Skin is warm and dry.  Psychiatric: Memory, affect and judgment normal.  Nursing note and vitals reviewed.    Assessment & Plan  1. Annual physical exam Age appropriate laboratory screenings. - CBC with Differential - Comprehensive Metabolic Panel (CMET) - Lipid Profile - PSA - TSH - Vitamin D (25 hydroxy)  2. Chronic obstructive pulmonary disease, unspecified COPD type ([place]) Started on albuterol inhaler to use for cough and wheezing as needed. - albuterol (PROVENTIL HFA;VENTOLIN HFA) 108 (90 Base) MCG/ACT inhaler; Inhale 2 puffs into the lungs every 6 (six) hours as needed for wheezing or shortness of breath.  Dispense: 1 Inhaler; Refill: 0  3. Sinus bradycardia on ECG Unchanged from EKG in the past. We will refer to cardiology to determine the possible etiology of bradycardia - Ambulatory referral to Cardiology - EKG 12-Lead  4. Nasal cavity mass  - Ambulatory referral to ENT   Kassem Kibbe [facility] 12/31/2015 9:36 AM

## 2016-01-01 LAB — CBC WITH DIFFERENTIAL/PLATELET
Basophils Absolute: 0 10*3/uL (ref 0.0–0.2)
Basos: 1 %
EOS (ABSOLUTE): 0.2 10*3/uL (ref 0.0–0.4)
Eos: 3 %
HEMOGLOBIN: 14.8 g/dL (ref 12.6–17.7)
Hematocrit: 42.9 % (ref 37.5–51.0)
Immature Grans (Abs): 0.1 10*3/uL (ref 0.0–0.1)
Immature Granulocytes: 1 %
LYMPHS ABS: 3.4 10*3/uL — AB (ref 0.7–3.1)
Lymphs: 39 %
MCH: 30.4 pg (ref 26.6–33.0)
MCHC: 34.5 g/dL (ref 31.5–35.7)
MCV: 88 fL (ref 79–97)
Monocytes Absolute: 0.8 10*3/uL (ref 0.1–0.9)
Monocytes: 9 %
Neutrophils Absolute: 4.3 10*3/uL (ref 1.4–7.0)
Neutrophils: 47 %
Platelets: 222 10*3/uL (ref 150–379)
RBC: 4.87 x10E6/uL (ref 4.14–5.80)
RDW: 14.6 % (ref 12.3–15.4)
WBC: 8.8 10*3/uL (ref 3.4–10.8)

## 2016-01-01 LAB — COMPREHENSIVE METABOLIC PANEL
ALK PHOS: 122 IU/L — AB (ref 39–117)
ALT: 37 IU/L (ref 0–44)
AST: 32 IU/L (ref 0–40)
Albumin/Globulin Ratio: 1.4 (ref 1.1–2.5)
Albumin: 4.2 g/dL (ref 3.6–4.8)
BUN/Creatinine Ratio: 11 (ref 10–22)
BUN: 12 mg/dL (ref 8–27)
Bilirubin Total: 0.8 mg/dL (ref 0.0–1.2)
CALCIUM: 9.5 mg/dL (ref 8.6–10.2)
CHLORIDE: 102 mmol/L (ref 96–106)
CO2: 24 mmol/L (ref 18–29)
Creatinine, Ser: 1.11 mg/dL (ref 0.76–1.27)
GFR calc Af Amer: 82 mL/min/{1.73_m2} (ref >59)
GFR calc non Af Amer: 71 mL/min/{1.73_m2} (ref >59)
GLOBULIN, TOTAL: 3.1 g/dL (ref 1.5–4.5)
Glucose: 105 mg/dL — ABNORMAL HIGH (ref 65–99)
Potassium: 4.9 mmol/L (ref 3.5–5.2)
Sodium: 143 mmol/L (ref 134–144)
Total Protein: 7.3 g/dL (ref 6.0–8.5)

## 2016-01-01 LAB — PSA: Prostate Specific Ag, Serum: 1.2 ng/mL (ref 0.0–4.0)

## 2016-01-01 LAB — LIPID PANEL
CHOLESTEROL TOTAL: 182 mg/dL (ref 100–199)
Chol/HDL Ratio: 4.7 ratio units (ref 0.0–5.0)
HDL: 39 mg/dL — AB (ref >39)
LDL CALC: 95 mg/dL (ref 0–99)
TRIGLYCERIDES: 241 mg/dL — AB (ref 0–149)
VLDL Cholesterol Cal: 48 mg/dL — ABNORMAL HIGH (ref 5–40)

## 2016-01-01 LAB — TSH: TSH: 1.69 u[IU]/mL (ref 0.450–4.500)

## 2016-01-01 LAB — VITAMIN D 25 HYDROXY (VIT D DEFICIENCY, FRACTURES): VIT D 25 HYDROXY: 23.5 ng/mL — AB (ref 30.0–100.0)

## 2016-01-07 ENCOUNTER — Telehealth: Payer: Self-pay

## 2016-01-07 MED ORDER — VITAMIN D (ERGOCALCIFEROL) 1.25 MG (50000 UNIT) PO CAPS: 50000.0000 [IU] | ORAL_CAPSULE | ORAL | Status: DC

## 2016-01-07 NOTE — Telephone Encounter (Signed)
Prescription for Vitamin D3 50,000 units 1 capsule once a week for 12 weeks has been sent to [facility] per Dr. Manuella Ghazi

## 2016-01-19 ENCOUNTER — Encounter: Payer: Self-pay | Admitting: Family Medicine

## 2016-01-19 ENCOUNTER — Ambulatory Visit (INDEPENDENT_AMBULATORY_CARE_PROVIDER_SITE_OTHER): Payer: Commercial Managed Care - HMO | Admitting: Family Medicine

## 2016-01-19 VITALS — BP 132/66 | HR 69 | Temp 98.4°F | Resp 15 | Ht 70.0 in | Wt 201.1 lb

## 2016-01-19 DIAGNOSIS — E781 Pure hyperglyceridemia: Secondary | ICD-10-CM | POA: Insufficient documentation

## 2016-01-19 DIAGNOSIS — R12 Heartburn: Secondary | ICD-10-CM | POA: Insufficient documentation

## 2016-01-19 MED ORDER — ICOSAPENT ETHYL 1 G PO CAPS: 2.0000 | ORAL_CAPSULE | Freq: Two times a day (BID) | ORAL | Status: DC

## 2016-01-19 MED ORDER — RANITIDINE HCL 150 MG PO TABS: 150.0000 mg | ORAL_TABLET | Freq: Every day | ORAL | Status: DC

## 2016-01-19 NOTE — Progress Notes (Signed)
Name: Andrew Mccarthy   MRN: [id number]    DOB: June 02, 1951   Date:01/19/2016       Progress Note  Subjective  Chief Complaint  Chief Complaint  Patient presents with  . Follow-up    Discuss Labs    HPI  Hypertriglyceridemia: Elevated Triglycerides, levels obtained 3 weeks ago were 241 mg/dL. Normal Total and LDL cholesterol.  As far as work-up for elevated triglycerides is concerned, pt. Has normal TSH, normal BUN, and LFTs. Will start on prescription-strength Fish oil therapy to lower triglycerides.  Heartburn: Persistent daily heartburn for several months (over 6 months), worse lately. He feels burning in his abdomen, mainly at night. In addition, having burps. He takes TUMS which helps temporarily relieve the heartburn. No weight loss, blood in stool, black colored stool, dysphagia etc.   Past Medical History  Diagnosis Date  . History of gastritis     not currently an issue  . 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 History of kidney stones last 2013  . Hypogonadism male   . Smoker   . ED (erectile dysfunction)   . Vitamin D deficiency   . Hyperlipidemia   . COPD (chronic obstructive pulmonary disease) [facility])     Past Surgical History  Procedure Laterality Date  . Kidney stone retraction  1993  . Lithotripsy  01/2012  . Esophagogastroduodenoscopy  09/2007    WNL,   . Colonoscopy  09/2007    WNL, rpt 10 yrs Andrew Mccarthy) Andrew Mccarthy  . Pilonidal cyst excision  2003  . Cardiovascular stress test  2010    myoview, WNL    Family History  Problem Relation Age of Onset  . Cancer Father 47    prostate  . Cancer Sister     breast  . Rheum arthritis Sister   . Cancer Son 35    colon  . Coronary artery disease Neg Hx   . Stroke Neg Hx   . Diabetes Neg Hx   . Alzheimer's disease Mother     Social History   Social History  . Marital Status: Married    Spouse Name: N/A  . Number of Children: N/A  . Years of Education: N/A   Occupational History  . Not on file.    Social History Main Topics  . Smoking status: Former Smoker -- 2.00 packs/day for 30 years    Types: Cigarettes    Quit date: 09/21/2015  . Smokeless tobacco: Never Used  . Alcohol Use: No  . Drug Use: No  . Sexual Activity: No   Other Topics Concern  . Not on file   Social History Narrative   Caffeine: 2 cups coffee/day, 4-5 mountain dew/day.   Lives with wife Andrew Mccarthy)   Occupation: Games developer, maintenance with [employer]   Edu: HS   Activity: no regular exercise, stays active at job   Diet: fruits/vegetables occasionally, lots of mountain dew     Current outpatient prescriptions:  .  albuterol (PROVENTIL HFA;VENTOLIN HFA) 108 (90 Base) MCG/ACT inhaler, Inhale 2 puffs into the lungs every 6 (six) hours as needed for wheezing or shortness of breath., Disp: 1 Inhaler, Rfl: 0 .  fluticasone (FLONASE) 50 MCG/ACT nasal spray, USE 2 SPRAYS EACH NOSTRIL EVERY DAY, Disp: , Rfl: 3 .  Fluticasone-Salmeterol (ADVAIR) 250-50 MCG/DOSE AEPB, Inhale 1 puff into the lungs 2 (two) times daily., Disp: , Rfl:  .  Vitamin D, Ergocalciferol, (DRISDOL) 50000 units CAPS capsule, Take 1 capsule (50,000 Units total) by mouth once  a week. For 12 weeks, Disp: 12 capsule, Rfl: 0 .  cholecalciferol (VITAMIN D) 1000 UNITS tablet, Take 1,000 Units by mouth daily. Reported on 01/19/2016, Disp: , Rfl:   No Known Allergies   Review of Systems  Respiratory: Negative for shortness of breath.   Cardiovascular: Negative for chest pain and palpitations.  Gastrointestinal: Positive for heartburn. Negative for nausea, vomiting, abdominal pain, diarrhea and constipation.    Objective  Filed Vitals:   01/19/16 1427  BP: 132/66  Pulse: 69  Temp: 98.4 F (36.9 C)  TempSrc: Oral  Resp: 15  Height: 5\' 10"  (1.778 m)  Weight: 201 lb 1.6 oz (91.218 kg)  SpO2: 96%    Physical Exam  Constitutional: He is oriented to person, place, and time and well-developed, well-nourished, and in no distress.  Cardiovascular:  Normal rate and regular rhythm.   Pulmonary/Chest: Effort normal and breath sounds normal.  Abdominal: Soft. Bowel sounds are normal. He exhibits no distension. There is no tenderness.  Neurological: He is alert and oriented to person, place, and time.  Nursing note and vitals reviewed.    Assessment & Plan  1. Hypertriglyceridemia Started on Vascepa for lowering triglyceride levels. Recheck in 3 months - Icosapent Ethyl (VASCEPA) 1 g CAPS; Take 2 capsules by mouth 2 (two) times daily with a meal.  Dispense: 120 capsule; Refill: 2  2. Heartburn Recommended that he take Zantac 150 milligrams at bedtime for relief of heartburn symptoms. Further workup if no improvement in symptoms with Zantac. - ranitidine (ZANTAC) 150 MG tablet; Take 1 tablet (150 mg total) by mouth daily after supper.  Dispense: 30 tablet; Refill: 0   Andrew Mccarthy [facility] 01/19/2016 2:51 PM

## 2016-01-20 ENCOUNTER — Telehealth: Payer: Self-pay | Admitting: Family Medicine

## 2016-01-20 ENCOUNTER — Encounter: Payer: Self-pay | Admitting: Cardiology

## 2016-01-20 ENCOUNTER — Ambulatory Visit (INDEPENDENT_AMBULATORY_CARE_PROVIDER_SITE_OTHER): Payer: Commercial Managed Care - HMO | Admitting: Cardiology

## 2016-01-20 VITALS — BP 110/80 | HR 72 | Ht 67.0 in | Wt 200.0 lb

## 2016-01-20 DIAGNOSIS — R5382 Chronic fatigue, unspecified: Secondary | ICD-10-CM

## 2016-01-20 DIAGNOSIS — R0602 Shortness of breath: Secondary | ICD-10-CM

## 2016-01-20 DIAGNOSIS — R001 Bradycardia, unspecified: Secondary | ICD-10-CM | POA: Diagnosis not present

## 2016-01-20 DIAGNOSIS — E669 Obesity, unspecified: Secondary | ICD-10-CM | POA: Diagnosis not present

## 2016-01-20 NOTE — Telephone Encounter (Signed)
Patient did not discuss any lab work for testosterone at his office visit appointment yesterday. Please schedule for an appointment to discuss his symptoms and order appropriate lab work.

## 2016-01-20 NOTE — Patient Instructions (Addendum)
Medication Instructions:  Your physician recommends that you continue on your current medications as directed. Please refer to the Current Medication list given to you today.   Labwork: None Ordered  Testing/Procedures: Your physician has recommended that you wear a holter monitor. Holter monitors are medical devices that record the heart's electrical activity. Doctors most often use these monitors to diagnose arrhythmias. Arrhythmias are problems with the speed or rhythm of the heartbeat. The monitor is a small, portable device. You can wear one while you do your normal daily activities. This is usually used to diagnose what is causing palpitations/syncope (passing out).  Date & Time:_________________________________________________________________   Your physician has requested that you have an echocardiogram. Echocardiography is a painless test that uses sound waves to create images of your heart. It provides your doctor with information about the size and shape of your heart and how well your heart's chambers and valves are working. This procedure takes approximately one hour. There are no restrictions for this procedure.  Date & Time: __________________________________________________________________   Your physician has requested that you have en exercise stress myoview. For further information please visit [email or web address]. Please follow instruction sheet, as given.  Date & Time: __________March 23, 2017 at 07:30AM _________________________  Follow-Up: Your physician recommends that you schedule a follow-up appointment after testing to review results.   Date & Time:__________________________________________________________________   Any Other Special Instructions Will Be Listed Below (If Applicable). [place]  Your caregiver has ordered a Stress Test with nuclear imaging. The purpose of this test is to evaluate the blood supply to your heart muscle. This procedure is  referred to as a "Non-Invasive Stress Test." This is because other than having an IV started in your vein, nothing is inserted or "invades" your body. Cardiac stress tests are done to find areas of poor blood flow to the heart by determining the extent of coronary artery disease (CAD). Some patients exercise on a treadmill, which naturally increases the blood flow to your heart, while others who are  unable to walk on a treadmill due to physical limitations have a pharmacologic/chemical stress agent called Lexiscan . This medicine will mimic walking on a treadmill by temporarily increasing your coronary blood flow.   Please note: these test may take anywhere between 2-4 hours to complete  PLEASE REPORT TO [place] AT THE FIRST DESK WILL DIRECT YOU WHERE TO GO  Date of Procedure:____March 23, 2017 at 07:30 AM__________  Arrival Time for Procedure:____Arrive at 07:15AM to regsiter_________________  Instructions regarding medication:   _N/A___ : Hold diabetes medication morning of procedure  _N/A___:  Hold betablocker(s) night before procedure and morning of procedure  _N/A___:  Hold other medications as follows:_________________________________________________________________________________________________________________________________________________________________________________________________________________________________________________________________________________________  PLEASE NOTIFY THE OFFICE AT LEAST 24 HOURS IN ADVANCE IF YOU ARE UNABLE TO KEEP YOUR APPOINTMENT.  [phone] AND  PLEASE NOTIFY NUCLEAR MEDICINE AT [facility] AT LEAST 24 HOURS IN ADVANCE IF YOU ARE UNABLE TO KEEP YOUR APPOINTMENT. [phone]  How to prepare for your Myoview test:  1. Do not eat or drink after midnight 2. No caffeine for 24 hours prior to test 3. No smoking 24 hours prior to test. 4. Your medication may be taken with water.  If your doctor stopped a  medication because of this test, do not take that medication. 5. Ladies, please do not wear dresses.  Skirts or pants are appropriate. Please wear a short sleeve shirt. 6. No perfume, cologne or lotion. 7. Wear comfortable walking shoes. No  heels!              If you need a refill on your cardiac medications before your next appointment, please call your pharmacy.  Echocardiogram An echocardiogram, or echocardiography, uses sound waves (ultrasound) to produce an image of your heart. The echocardiogram is simple, painless, obtained within a short period of time, and offers valuable information to your health care provider. The images from an echocardiogram can provide information such as:  Evidence of coronary artery disease (CAD).  Heart size.  Heart muscle function.  Heart valve function.  Aneurysm detection.  Evidence of a past heart attack.  Fluid buildup around the heart.  Heart muscle thickening.  Assess heart valve function. LET [facility] CARE PROVIDER KNOW ABOUT:  Any allergies you have.  All medicines you are taking, including vitamins, herbs, eye drops, creams, and over-the-counter medicines.  Previous problems you or members of your family have had with the use of anesthetics.  Any blood disorders you have.  Previous surgeries you have had.  Medical conditions you have.  Possibility of pregnancy, if this applies. BEFORE THE PROCEDURE  No special preparation is needed. Eat and drink normally.  PROCEDURE  8. In order to produce an image of your heart, gel will be applied to your chest and a wand-like tool (transducer) will be moved over your chest. The gel will help transmit the sound waves from the transducer. The sound waves will harmlessly bounce off your heart to allow the heart images to be captured in real-time motion. These images will then be recorded. 9. You may need an IV to receive a medicine that improves the quality of the pictures. AFTER  THE PROCEDURE You may return to your normal schedule including diet, activities, and medicines, unless your health care provider tells you otherwise.   This information is not intended to replace advice given to you by your health care provider. Make sure you discuss any questions you have with your health care provider.   Document Released: 10/21/2000 Document Revised: 11/14/2014 Document Reviewed: 07/01/2013 Elsevier Interactive Patient Education 2016 Elsevier Inc[employer]   Holter Monitoring A Holter monitor is a small device that is used to detect abnormal heart rhythms. It clips to your clothing and is connected by wires to flat, sticky disks (electrodes) that attach to your chest. It is worn continuously for 24-48 hours. HOME CARE INSTRUCTIONS  Wear your Holter monitor at all times, even while exercising and sleeping, for as long as directed by your health care provider.  Make sure that the Holter monitor is safely clipped to your clothing or close to your body as recommended by your health care provider.  Do not get the monitor or wires wet.  Do not put body lotion or moisturizer on your chest.  Keep your skin clean.  Keep a diary of your daily activities, such as walking and doing chores. If you feel that your heartbeat is abnormal or that your heart is fluttering or skipping a beat:  Record what you are doing when it happens.  Record what time of day the symptoms occur.  Return your Holter monitor as directed by your health care provider.  Keep all follow-up visits as directed by your health care provider. This is important. SEEK IMMEDIATE MEDICAL CARE IF:  You feel lightheaded or you faint.  You have trouble breathing.  You feel pain in your chest, upper arm, or jaw.  You feel sick to your stomach and your skin is pale, cool,  or damp.  You heartbeat feels unusual or abnormal.   This information is not intended to replace advice given to you by your health care provider.  Make sure you discuss any questions you have with your health care provider.   Document Released: 07/22/2004 Document Revised: 11/14/2014 Document Reviewed: 06/02/2014 Elsevier Interactive Patient Education 2016 [place].   Cardiac Nuclear Scanning A cardiac nuclear scan is used to check your heart for problems, such as the following:  A portion of the heart is not getting enough blood.  Part of the heart muscle has died, which happens with a heart attack.  The heart wall is not working normally.  In this test, a radioactive dye (tracer) is injected into your bloodstream. After the tracer has traveled to your heart, a scanning device is used to measure how much of the tracer is absorbed by or distributed to various areas of your heart. LET [facility] CARE PROVIDER KNOW ABOUT:  Any allergies you have.  All medicines you are taking, including vitamins, herbs, eye drops, creams, and over-the-counter medicines.  Previous problems you or members of your family have had with the use of anesthetics.  Any blood disorders you have.  Previous surgeries you have had.  Medical conditions you have.  RISKS AND COMPLICATIONS Generally, this is a safe procedure. However, as with any procedure, problems can occur. Possible problems include:  10. Serious chest pain. 11. Rapid heartbeat. 12. Sensation of warmth in your chest. This usually passes quickly. BEFORE THE PROCEDURE Ask your health care provider about changing or stopping your regular medicines. PROCEDURE This procedure is usually done at a hospital and takes 2-4 hours.  An IV tube is inserted into one of your veins.  Your health care provider will inject a small amount of radioactive tracer through the tube.  You will then wait for 20-40 minutes while the tracer travels through your bloodstream.  You will lie down on an exam table so images of your heart can be taken. Images will be taken for about 15-20 minutes.  You  will exercise on a treadmill or stationary bike. While you exercise, your heart activity will be monitored with an electrocardiogram (ECG), and your blood pressure will be checked.  If you are unable to exercise, you may be given a medicine to make your heart beat faster.  When blood flow to your heart has peaked, tracer will again be injected through the IV tube.  After 20-40 minutes, you will get back on the exam table and have more images taken of your heart.  When the procedure is over, your IV tube will be removed. AFTER THE PROCEDURE  You will likely be able to leave shortly after the test. Unless your health care provider tells you otherwise, you may return to your normal schedule, including diet, activities, and medicines.  Make sure you find out how and when you will get your test results.   This information is not intended to replace advice given to you by your health care provider. Make sure you discuss any questions you have with your health care provider.   Document Released: 11/18/2004 Document Revised: 10/29/2013 Document Reviewed: 10/02/2013 Elsevier Interactive Patient Education [employer].

## 2016-01-20 NOTE — Progress Notes (Signed)
Cardiology Office Note   Date:  01/20/2016   ID:  Andrew Mccarthy, DOB 25-Mar-1951, MRN [id number]  Referring Doctor:  [name], MD   Cardiologist:   Wende Bushy, MD   Reason for consultation:  Chief Complaint  Patient presents with  . Other    Bradycardia&sob. Meds reviewed verbally with pt.      History of Present Illness: Andrew Mccarthy is a 65 y.o. male who presents for Evaluation of slow heart rate noted on EKG, as well as shortness of breath.  EKG from 12/31/2015 at 10 in the morning was personally reviewed by me and it showed sinus bradycardia at 52 BPM. Patient cannot recall exactly the reason for the EKG. He shows me his after visit summary from yesterday's visit and his heart rate was noted to be 69 BPM. Today heart rate is 72 BPM.  Patient complains of shortness of breath and fatigue. This has been going on for 5-6 months now. He had severe shortness of breath associated with an episode of bronchitis back in December. That has resolved. But he continues to have shortness of breath with minimal exertion. Severity is moderate, symptoms in the chest, nonradiating, occurs with exertion, resolved at rest. No associated chest pain, nausea, diaphoresis.  He complains of fatigue as well. He describes it as having no energy at all. He is not sure if this is lack of motivation. He just retired July 2016. This was when he noticed this fatigue. He feels that he's sleeping enough hours at night, sometimes it would be in bed for total of 10 hours. But despite that, he would have daytime fatigue and having to take naps at least 2 naps in a day. He thinks he snores. This fatigue somewhat impairs him in his functioning.  Patient denies headache, fever, cough, colds, abdominal pain, orthopnea, PND, edema.  ROS:  Please see the history of present illness. Aside from mentioned under HPI, all other systems are reviewed and negative.     Past Medical History  Diagnosis Date  . History of  gastritis     not currently an issue  . 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 History of kidney stones last 2013  . Hypogonadism male   . Smoker   . ED (erectile dysfunction)   . Vitamin D deficiency   . Hyperlipidemia   . COPD (chronic obstructive pulmonary disease) [facility])     Past Surgical History  Procedure Laterality Date  . Kidney stone retraction  1993  . Lithotripsy  01/2012  . Esophagogastroduodenoscopy  09/2007    WNL,   . Colonoscopy  09/2007    WNL, rpt 10 yrs [name]) [place]  . Pilonidal cyst excision  2003  . Cardiovascular stress test  2010    myoview, WNL  Patient does not recall stress test results from many years ago   reports that he quit smoking about 3 months ago. His smoking use included Cigarettes. He has a 60 pack-year smoking history. He has never used smokeless tobacco. He reports that he does not drink alcohol or use illicit drugs.   family history includes Alzheimer's disease in his mother; Cancer in his sister; Cancer (age of onset: 33) in his son; Cancer (age of onset: 32) in his father; Rheum arthritis in his sister. There is no history of Coronary artery disease, Stroke, or Diabetes. No history of sudden cardiac death  Current Outpatient Prescriptions  Medication Sig Dispense Refill  . albuterol (  PROVENTIL HFA;VENTOLIN HFA) 108 (90 Base) MCG/ACT inhaler Inhale 2 puffs into the lungs every 6 (six) hours as needed for wheezing or shortness of breath. 1 Inhaler 0  . cholecalciferol (VITAMIN D) 1000 UNITS tablet Take 1,000 Units by mouth daily. Reported on 01/19/2016    . fluticasone (FLONASE) 50 MCG/ACT nasal spray USE 2 SPRAYS EACH NOSTRIL EVERY DAY AS NEEDED.  3  . Fluticasone-Salmeterol (ADVAIR) 250-50 MCG/DOSE AEPB Inhale 1 puff into the lungs 2 (two) times daily as needed.     Vanessa Kick Ethyl (VASCEPA) 1 g CAPS Take 2 capsules by mouth 2 (two) times daily with a meal. (Patient taking differently: Take 2 capsules by mouth 2 (two) times daily. )  120 capsule 2  . ranitidine (ZANTAC) 150 MG tablet Take 1 tablet (150 mg total) by mouth daily after supper. 30 tablet 0  . Vitamin D, Ergocalciferol, (DRISDOL) 50000 units CAPS capsule Take 1 capsule (50,000 Units total) by mouth once a week. For 12 weeks 12 capsule 0   No current facility-administered medications for this visit.    Allergies: Review of patient's allergies indicates no known allergies.    PHYSICAL EXAM: VS:  BP 110/80 mmHg  Pulse 72  Ht 5\' 7"  (1.702 m)  Wt 200 lb (90.719 kg)  BMI 31.32 kg/m2 , Body mass index is 31.32 kg/(m^2). Wt Readings from Last 3 Encounters:  01/20/16 200 lb (90.719 kg)  01/19/16 201 lb 1.6 oz (91.218 kg)  12/31/15 197 lb 9.6 oz (89.631 kg)    GENERAL:  well developed, well nourished,  obese, not in acute distress HEENT: normocephalic, pink conjunctivae, anicteric sclerae, no xanthelasma, normal dentition, oropharynx clear NECK:  no neck vein engorgement, JVP normal, no hepatojugular reflux, carotid upstroke brisk and symmetric, no bruit, no thyromegaly, no lymphadenopathy LUNGS:  good respiratory effort, clear to auscultation bilaterally CV:  PMI not displaced, no thrills, no lifts, S1 and S2 within normal limits, no palpable S3 or S4, no murmurs, no rubs, no gallops ABD:  Soft, nontender, nondistended, normoactive bowel sounds, no abdominal aortic bruit, no hepatomegaly, no splenomegaly MS: nontender back, no kyphosis, no scoliosis, no joint deformities EXT:  2+ DP/PT pulses, no edema, no varicosities, no cyanosis, no clubbing SKIN: warm, nondiaphoretic, normal turgor, no ulcers NEUROPSYCH: alert, oriented to person, place, and time, sensory/motor grossly intact, normal mood, appropriate affect  Recent Labs: 12/31/2015: ALT 37; BUN 12; Creatinine, Ser 1.11; Platelets 222; Potassium 4.9; Sodium 143; TSH 1.690   Lipid Panel    Component Value Date/Time   CHOL 182 12/31/2015 1033   CHOL 133 10/15/2012 0923   CHOL 165 10/21/2011   TRIG  241* 12/31/2015 1033   TRIG 235 10/21/2011   HDL 39* 12/31/2015 1033   HDL 35.60* 10/15/2012 0923   CHOLHDL 4.7 12/31/2015 1033   CHOLHDL 4 10/15/2012 0923   VLDL 23.2 10/15/2012 0923   LDLCALC 95 12/31/2015 1033   LDLCALC 74 10/15/2012 0923   LDLDIRECT 83.6 10/21/2011     Other studies Reviewed:  EKG:  EKG  ordered today. The ekg ordered 01/20/2016  was personally reviewed by me and it reveals Sinus rhythm, 72 BPM. Presence of artifact, otherwise normal EKG.  Additional studies/ records that were reviewed personally reviewed by me today include: None available for review   ASSESSMENT AND PLAN:   Bradycardia, sinus bradycardia Noted on EKG from 12/31/2015. Heart rate today is sinus rhythm. Recommend further investigation with 24-hour Holter monitor.  Shortness of breath with exertion Patient has risk  factors for coronary artery disease including age, gender, obesity. It will be prudent to proceed with an exercise nuclear stress test for further evaluation. Recommend exercise protocol to evaluate for chronotropic competence as well. Recommend echocardiogram.  Fatigue Last a 6 level was within normal limits. Doubt that this is related to bradycardia, he is not bradycardic today. 24-hour Holter monitor will be ordered anyway. Recommend evaluation for sleep study to rule out sleep apnea. Defer to PCP.  Obesity Body mass index is 31.32 kg/(m^2).Marland Kitchen Recommend aggressive weight loss through diet and increased physical activity.     Current medicines are reviewed at length with the patient today.  The patient does not have concerns regarding medicines.  Labs/ tests ordered today include:  Orders Placed This Encounter  Procedures  . NM Myocar Multi W/Spect W/Wall Motion / EF  . Holter monitor - 24 hour  . EKG 12-Lead  . Echocardiogram    I had a lengthy and detailed discussion with the patient regarding diagnoses, prognosis, diagnostic options, treatment options.  I counseled  the patient on importance of lifestyle modification including heart healthy diet, regular physical activity.  Disposition:   FU with undersigned after tests    Signed, Wende Bushy, MD  01/20/2016 11:11 AM    [place]

## 2016-01-20 NOTE — Telephone Encounter (Signed)
Pts wife called and stated she would like a call back on behalf of her husband. He would like a testosterone lab ordered for him. Pt came in a for his CPE 2 wks ago and was told this test has to be done really early in the morning. Please return her call or his call.

## 2016-01-20 NOTE — Telephone Encounter (Signed)
Routed to Dr. Manuella Ghazi for labs

## 2016-01-22 ENCOUNTER — Other Ambulatory Visit: Payer: Self-pay | Admitting: Family Medicine

## 2016-01-22 ENCOUNTER — Other Ambulatory Visit: Payer: Self-pay

## 2016-01-22 DIAGNOSIS — E291 Testicular hypofunction: Secondary | ICD-10-CM

## 2016-01-28 ENCOUNTER — Encounter
Admission: RE | Admit: 2016-01-28 | Discharge: 2016-01-28 | Disposition: A | Discharge status: Home / Self Care | Payer: Commercial Managed Care - HMO | Source: Ambulatory Visit | Attending: Cardiology | Admitting: Cardiology

## 2016-01-28 DIAGNOSIS — R0602 Shortness of breath: Secondary | ICD-10-CM | POA: Diagnosis not present

## 2016-01-28 DIAGNOSIS — R5382 Chronic fatigue, unspecified: Secondary | ICD-10-CM | POA: Diagnosis present

## 2016-01-28 DIAGNOSIS — R001 Bradycardia, unspecified: Secondary | ICD-10-CM | POA: Diagnosis present

## 2016-01-28 LAB — NM MYOCAR MULTI W/SPECT W/WALL MOTION / EF
CHL CUP NUCLEAR SDS: 0
CHL CUP NUCLEAR SRS: 0
CHL CUP NUCLEAR SSS: 4
CSEPED: 6 min
CSEPEW: 7 METS
LVDIAVOL: 100 mL (ref 62–150)
LVSYSVOL: 43 mL
Peak HR: 139 {beats}/min
Percent HR: 87 %
Rest HR: 50 {beats}/min
TID: 0.72

## 2016-01-28 MED ORDER — TECHNETIUM TC 99M SESTAMIBI - CARDIOLITE
13.8600 | Freq: Once | INTRAVENOUS | Status: AC | PRN
Start: 2016-01-28 — End: 2016-01-28
  Administered 2016-01-28: 13.86 via INTRAVENOUS

## 2016-01-28 MED ORDER — TECHNETIUM TC 99M SESTAMIBI - CARDIOLITE
32.5230 | Freq: Once | INTRAVENOUS | Status: AC | PRN
Start: 2016-01-28 — End: 2016-01-28
  Administered 2016-01-28: 32.523 via INTRAVENOUS

## 2016-02-16 ENCOUNTER — Other Ambulatory Visit: Payer: Commercial Managed Care - HMO

## 2016-02-25 ENCOUNTER — Ambulatory Visit: Payer: Commercial Managed Care - HMO | Admitting: Cardiology

## 2016-03-04 ENCOUNTER — Telehealth: Payer: Self-pay | Admitting: Cardiology

## 2016-03-04 ENCOUNTER — Other Ambulatory Visit: Payer: Self-pay

## 2016-03-04 ENCOUNTER — Ambulatory Visit (INDEPENDENT_AMBULATORY_CARE_PROVIDER_SITE_OTHER): Payer: Commercial Managed Care - HMO

## 2016-03-04 DIAGNOSIS — R5382 Chronic fatigue, unspecified: Secondary | ICD-10-CM

## 2016-03-04 DIAGNOSIS — R0602 Shortness of breath: Secondary | ICD-10-CM | POA: Diagnosis not present

## 2016-03-04 DIAGNOSIS — R001 Bradycardia, unspecified: Secondary | ICD-10-CM

## 2016-03-04 NOTE — Telephone Encounter (Signed)
Patient was in today to have echo and picked up his holter monitor. He had an emergency at home and is unable to wear the monitor at this time. He will bring the monitor back in on Monday and then reschedule another date and time when he can wear it. Patients wife had no further questions at this time.

## 2016-03-04 NOTE — Telephone Encounter (Signed)
Patient monitor disconnected as he has been working outside and is hot and sweaty.  The electrodes will no longer stick.  Patient wants to know what to do with the monitor.  Please call wife deborah to discuss what to do .

## 2016-03-10 ENCOUNTER — Ambulatory Visit (INDEPENDENT_AMBULATORY_CARE_PROVIDER_SITE_OTHER): Payer: Commercial Managed Care - HMO | Admitting: Cardiology

## 2016-03-10 ENCOUNTER — Encounter: Payer: Self-pay | Admitting: Cardiology

## 2016-03-10 VITALS — BP 110/74 | HR 56 | Ht 68.0 in | Wt 197.2 lb

## 2016-03-10 DIAGNOSIS — E669 Obesity, unspecified: Secondary | ICD-10-CM | POA: Insufficient documentation

## 2016-03-10 DIAGNOSIS — R001 Bradycardia, unspecified: Secondary | ICD-10-CM | POA: Diagnosis not present

## 2016-03-10 DIAGNOSIS — R0602 Shortness of breath: Secondary | ICD-10-CM | POA: Insufficient documentation

## 2016-03-10 DIAGNOSIS — R5382 Chronic fatigue, unspecified: Secondary | ICD-10-CM | POA: Diagnosis not present

## 2016-03-10 NOTE — Patient Instructions (Signed)
Medication Instructions:  Your physician recommends that you continue on your current medications as directed. Please refer to the Current Medication list given to you today.   Labwork: None ordered  Testing/Procedures: Your physician has recommended that you wear a holter monitor. Holter monitors are medical devices that record the heart's electrical activity. Doctors most often use these monitors to diagnose arrhythmias. Arrhythmias are problems with the speed or rhythm of the heartbeat. The monitor is a small, portable device. You can wear one while you do your normal daily activities. This is usually used to diagnose what is causing palpitations/syncope (passing out).  Date & Time: ____________________________________________________________________ We will call you with results of this test.    Follow-Up: Your physician recommends that you schedule a follow-up appointment as needed.   Any Other Special Instructions Will Be Listed Below (If Applicable). Please see your primary care physician for possible sleep study due to fatigue and sleep issues.     If you need a refill on your cardiac medications before your next appointment, please call your pharmacy.
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
## 2016-03-18 ENCOUNTER — Ambulatory Visit
Admission: RE | Admit: 2016-03-18 | Discharge: 2016-03-18 | Disposition: A | Discharge status: Home / Self Care | Payer: Commercial Managed Care - HMO | Source: Ambulatory Visit | Attending: Cardiology | Admitting: Cardiology

## 2016-03-18 DIAGNOSIS — R001 Bradycardia, unspecified: Secondary | ICD-10-CM | POA: Insufficient documentation

## 2016-03-18 DIAGNOSIS — R0602 Shortness of breath: Secondary | ICD-10-CM | POA: Insufficient documentation

## 2016-03-28 ENCOUNTER — Other Ambulatory Visit: Payer: Self-pay | Admitting: Family Medicine

## 2016-04-11 ENCOUNTER — Other Ambulatory Visit: Payer: Self-pay | Admitting: Family Medicine

## 2016-06-02 ENCOUNTER — Other Ambulatory Visit: Payer: Self-pay | Admitting: Specialist

## 2016-06-10 ENCOUNTER — Telehealth: Payer: Self-pay | Admitting: *Deleted

## 2016-06-10 NOTE — Telephone Encounter (Signed)
Received referral for low dose lung cancer screening CT scan. Voicemail left at phone number listed in EMR for patient to call me back to facilitate scheduling scan.  

## 2016-06-13 ENCOUNTER — Telehealth: Payer: Self-pay | Admitting: *Deleted

## 2016-06-13 NOTE — Telephone Encounter (Signed)
Received referral for initial lung cancer screening scan. Contacted patient and obtained smoking history,(former, quit 09/2015, 70 pack year) as well as answering questions related to screening process. Patient denies signs of lung cancer such as weight loss or hemoptysis. Patient denies comorbidity that would prevent curative treatment if lung cancer were found. Patient is tentatively scheduled for shared decision making visit and CT scan on 06/21/16 at 1:30pm, pending insurance approval from business office.

## 2016-06-20 ENCOUNTER — Other Ambulatory Visit: Payer: Self-pay | Admitting: *Deleted

## 2016-06-20 DIAGNOSIS — Z87891 Personal history of nicotine dependence: Secondary | ICD-10-CM

## 2016-06-21 ENCOUNTER — Inpatient Hospital Stay: Payer: Commercial Managed Care - HMO | Attending: Oncology | Admitting: Oncology

## 2016-06-21 ENCOUNTER — Encounter: Payer: Self-pay | Admitting: Oncology

## 2016-06-21 ENCOUNTER — Ambulatory Visit
Admission: RE | Admit: 2016-06-21 | Discharge: 2016-06-21 | Disposition: A | Discharge status: Home / Self Care | Payer: Commercial Managed Care - HMO | Source: Ambulatory Visit | Attending: Oncology | Admitting: Oncology

## 2016-06-21 DIAGNOSIS — Z122 Encounter for screening for malignant neoplasm of respiratory organs: Secondary | ICD-10-CM | POA: Insufficient documentation

## 2016-06-21 DIAGNOSIS — K760 Fatty (change of) liver, not elsewhere classified: Secondary | ICD-10-CM | POA: Diagnosis not present

## 2016-06-21 DIAGNOSIS — I2510 Atherosclerotic heart disease of native coronary artery without angina pectoris: Secondary | ICD-10-CM | POA: Diagnosis not present

## 2016-06-21 DIAGNOSIS — J432 Centrilobular emphysema: Secondary | ICD-10-CM | POA: Insufficient documentation

## 2016-06-21 DIAGNOSIS — Z87891 Personal history of nicotine dependence: Secondary | ICD-10-CM | POA: Insufficient documentation

## 2016-06-21 DIAGNOSIS — I700 Atherosclerosis of aorta: Secondary | ICD-10-CM | POA: Insufficient documentation

## 2016-06-23 ENCOUNTER — Telehealth: Payer: Self-pay | Admitting: *Deleted

## 2016-06-23 NOTE — Telephone Encounter (Signed)
Please schedule for an appointment to discuss the low-dose CT scan of chest findings including the presence of aortic and coronary atherosclerosis and hepatic steatosis. Patient is being followed by cardiology.

## 2016-06-23 NOTE — Telephone Encounter (Signed)
Notified patient of LDCT lung cancer screening results with recommendation for 12 month follow up imaging. Also notified of incidental finding noted below, and encouraged to discuss further with pulmonary and PCP. Patient verbalizes understanding.   IMPRESSION: 1. Lung-RADS Category 2S, benign appearance or behavior. Continue annual screening with low-dose chest CT without contrast in 12 months. 2. The "S" modifier above refers to potentially clinically significant non lung cancer related findings. Specifically, there is aortic atherosclerosis, in addition to left anterior descending coronary artery disease. Please note that although the presence of coronary artery calcium documents the presence of coronary artery disease, the severity of this disease and any potential stenosis cannot be assessed on this non-gated CT examination. Assessment for potential risk factor modification, dietary therapy or pharmacologic therapy may be warranted, if clinically indicated. 3. 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 Attention at time of repeat lung cancer screening chest CT is recommended to ensure the stability of these findings. 4. Diffuse bronchial wall thickening with moderate centrilobular emphysema ; imaging findings suggestive of underlying COPD. 5. Hepatic steatosis.

## 2016-06-24 NOTE — Telephone Encounter (Signed)
Spoke with patient to schedule appt. He told me that someone called him with results on yesterday, but I read him your message. He then said okay. Therefore appointment scheduled for next week

## 2016-06-25 DIAGNOSIS — Z87891 Personal history of nicotine dependence: Secondary | ICD-10-CM | POA: Insufficient documentation

## 2016-06-25 NOTE — Progress Notes (Addendum)
In accordance with CMS guidelines, patient has met eligibility criteria including age, absence of signs or symptoms of lung cancer.  Social History  Substance Use Topics  . Smoking status: Former Smoker    Packs/day: 2.00    Years: 35.00    Types: Cigarettes    Quit date: 09/21/2015  . Smokeless tobacco: Never Used  . Alcohol use No     A shared decision-making session was conducted prior to the performance of CT scan. This includes one or more decision aids, includes benefits and harms of screening, follow-up diagnostic testing, over-diagnosis, false positive rate, and total radiation exposure.  Counseling on the importance of adherence to annual lung cancer LDCT screening, impact of co-morbidities, and ability or willingness to undergo diagnosis and treatment is imperative for compliance of the program.  Counseling on the importance of continued smoking cessation for former smokers; the importance of smoking cessation for current smokers, and information about tobacco cessation interventions have been given to patient including [place] and 1800 quit [place] programs.  Written order for lung cancer screening with LDCT has been given to the patient and any and all questions have been answered to the best of my abilities.   Yearly follow up will be coordinated by Burgess Estelle, Thoracic Navigator.

## 2016-06-28 ENCOUNTER — Ambulatory Visit (INDEPENDENT_AMBULATORY_CARE_PROVIDER_SITE_OTHER): Payer: Commercial Managed Care - HMO | Admitting: Family Medicine

## 2016-06-28 ENCOUNTER — Encounter: Payer: Self-pay | Admitting: Family Medicine

## 2016-06-28 VITALS — BP 140/82 | HR 60 | Temp 98.0°F | Resp 18 | Ht 68.0 in | Wt 204.2 lb

## 2016-06-28 DIAGNOSIS — R739 Hyperglycemia, unspecified: Secondary | ICD-10-CM | POA: Diagnosis not present

## 2016-06-28 DIAGNOSIS — J849 Interstitial pulmonary disease, unspecified: Secondary | ICD-10-CM | POA: Diagnosis not present

## 2016-06-28 DIAGNOSIS — I700 Atherosclerosis of aorta: Secondary | ICD-10-CM

## 2016-06-28 DIAGNOSIS — E781 Pure hyperglyceridemia: Secondary | ICD-10-CM | POA: Diagnosis not present

## 2016-06-28 DIAGNOSIS — J449 Chronic obstructive pulmonary disease, unspecified: Secondary | ICD-10-CM | POA: Diagnosis not present

## 2016-06-28 DIAGNOSIS — E559 Vitamin D deficiency, unspecified: Secondary | ICD-10-CM | POA: Diagnosis not present

## 2016-06-28 LAB — COMPLETE METABOLIC PANEL WITH GFR
ALT: 38 U/L (ref 9–46)
AST: 30 U/L (ref 10–35)
Albumin: 3.9 g/dL (ref 3.6–5.1)
Alkaline Phosphatase: 94 U/L (ref 40–115)
BILIRUBIN TOTAL: 0.6 mg/dL (ref 0.2–1.2)
BUN: 11 mg/dL (ref 7–25)
CO2: 26 mmol/L (ref 20–31)
Calcium: 9 mg/dL (ref 8.6–10.3)
Chloride: 106 mmol/L (ref 98–110)
Creat: 0.93 mg/dL (ref 0.70–1.25)
GFR, EST NON AFRICAN AMERICAN: 88 mL/min (ref >=60)
GFR, Est African American: >89 mL/min (ref >=60)
GLUCOSE: 97 mg/dL (ref 65–99)
Potassium: 4.2 mmol/L (ref 3.5–5.3)
SODIUM: 139 mmol/L (ref 135–146)
TOTAL PROTEIN: 6.9 g/dL (ref 6.1–8.1)

## 2016-06-28 LAB — LIPID PANEL
Cholesterol: 153 mg/dL (ref 125–200)
HDL: 38 mg/dL — AB (ref >=40)
LDL CALC: 81 mg/dL (ref <130)
Total CHOL/HDL Ratio: 4 Ratio (ref <=5.0)
Triglycerides: 172 mg/dL — ABNORMAL HIGH (ref <150)
VLDL: 34 mg/dL — ABNORMAL HIGH (ref <30)

## 2016-06-28 LAB — GLUCOSE, POCT (MANUAL RESULT ENTRY): POC GLUCOSE: 128 mg/dL — AB (ref 70–99)

## 2016-06-28 LAB — POCT GLYCOSYLATED HEMOGLOBIN (HGB A1C): Hemoglobin A1C: 6

## 2016-06-28 NOTE — Progress Notes (Signed)
Name: Andrew Mccarthy   MRN: [id number]    DOB: [birth year]   Date:06/28/2016       Progress Note  Subjective  Chief Complaint  Chief Complaint  Patient presents with  . Follow-up    CT results    HPI  Pt. Presents for review of low dose CT Chest findings. CT Scan due to history of smoking (2PPD for 35 years). Has been quit for 11 months. CT Scan showed the following findings Aortic Athersclerosis Fatty liver Non-specific Interstitial Pneumonitis Emphysema and COPD.  Past Medical History:  Diagnosis Date  . COPD (chronic obstructive pulmonary disease) ([place])   . ED (erectile dysfunction)   . History of gastritis    not currently an issue  . 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 History of kidney stones last 2013  . Hyperlipidemia   . Hypogonadism male   . Smoker   . Vitamin D deficiency     Past Surgical History:  Procedure Laterality Date  . CARDIOVASCULAR STRESS TEST  2010   myoview, WNL  . COLONOSCOPY  09/2007   WNL, rpt 10 yrs Andrew Mccarthy) [place]  . ESOPHAGOGASTRODUODENOSCOPY  09/2007   WNL,   . Kidney Stone Retraction  1993  . LITHOTRIPSY  01/2012  . PILONIDAL CYST EXCISION  2003    Family History  Problem Relation Age of Onset  . Cancer Father 74    prostate  . Cancer Sister     breast  . Rheum arthritis Sister   . Alzheimer's disease Mother   . Cancer Son 74    colon  . Coronary artery disease Neg Hx   . Stroke Neg Hx   . Diabetes Neg Hx     Social History   Social History  . Marital status: Married    Spouse name: N/A  . Number of children: N/A  . Years of education: N/A   Occupational History  . Not on file.   Social History Main Topics  . Smoking status: Former Smoker    Packs/day: 2.00    Years: 35.00    Types: Cigarettes    Quit date: 09/21/2015  . Smokeless tobacco: Never Used  . Alcohol use No  . Drug use: No  . Sexual activity: No   Other Topics Concern  . Not on file   Social History Narrative   Caffeine: 2 cups  coffee/day, 4-5 mountain dew/day.   Lives with wife Andrew Mccarthy)   Occupation: Games developer, maintenance with Andrew [employer]   Edu: HS   Activity: no regular exercise, stays active at job   Diet: fruits/vegetables occasionally, lots of mountain dew     Current Outpatient Prescriptions:  .  albuterol (PROVENTIL HFA;VENTOLIN HFA) 108 (90 Base) MCG/ACT inhaler, Inhale 2 puffs into the lungs every 6 (six) hours as needed for wheezing or shortness of breath., Disp: 1 Inhaler, Rfl: 0 .  aspirin 81 MG tablet, Take 81 mg by mouth daily., Disp: , Rfl:  .  cholecalciferol (VITAMIN D) 1000 UNITS tablet, Take 1,000 Units by mouth daily. Reported on 01/19/2016, Disp: , Rfl:  .  fluticasone (FLONASE) 50 MCG/ACT nasal spray, USE 2 SPRAYS EACH NOSTRIL EVERY DAY AS NEEDED., Disp: , Rfl: 3 .  Fluticasone-Salmeterol (ADVAIR) 250-50 MCG/DOSE AEPB, Inhale 1 puff into the lungs 2 (two) times daily as needed. , Disp: , Rfl:  .  Icosapent Ethyl (VASCEPA) 1 g CAPS, Take 2 capsules by mouth 2 (two) times daily with a meal. (Patient taking differently: Take 2 capsules  by mouth 2 (two) times daily. ), Disp: 120 capsule, Rfl: 2 .  ranitidine (ZANTAC) 150 MG tablet, Take 1 tablet (150 mg total) by mouth daily after supper. (Patient taking differently: Take 150 mg by mouth daily as needed. ), Disp: 30 tablet, Rfl: 0 .  Vitamin D, Ergocalciferol, (DRISDOL) 50000 units CAPS capsule, Take 1 capsule (50,000 Units total) by mouth once a week. For 12 weeks, Disp: 12 capsule, Rfl: 0  No Known Allergies   Review of Systems  Respiratory: Positive for cough.   Cardiovascular: Negative for chest pain.    Objective  Vitals:   06/28/16 0902  BP: 140/82  Pulse: 60  Resp: 18  Temp: 98 F (36.7 C)  TempSrc: Oral  SpO2: 97%  Weight: 204 lb 3.2 oz (92.6 kg)  Height: 5\' 8"  (1.727 m)    Physical Exam  Constitutional: He is oriented to person, place, and time and well-developed, well-nourished, and in no distress.  HENT:  Head:  Normocephalic and atraumatic.  Cardiovascular: Normal rate, regular rhythm and normal heart sounds.   No murmur heard. Pulmonary/Chest: Effort normal and breath sounds normal. He has no wheezes.  Abdominal: Soft. Bowel sounds are normal.  Musculoskeletal: He exhibits no edema.  Neurological: He is alert and oriented to person, place, and time.  Psychiatric: Mood, memory, affect and judgment normal.  Nursing note and vitals reviewed.    Assessment & Plan  1. Chronic obstructive pulmonary disease, unspecified COPD type ([place]) Without exacerbation. Being followed by pulmonology and on rescue inhaler along with ICS-LABA. As stopped smoking now.  2. Aortic atherosclerosis ([place]) Educated on potential implications including unstable plaque. Advised to continue taking aspirin 81 mg daily. Encouraged regular exercise to achieve healthy body weight and avoid foods high in cholesterol  - Lipid Profile  3. 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 Hypertriglyceridemia  - Lipid Profile - COMPLETE METABOLIC PANEL WITH GFR  5. Vitamin D deficiency  - Vitamin D (25 hydroxy)  6. Hyperglycemia  - POCT HgB A1C   Lorelei Heikkila Asad A[facility] 06/28/2016 9:11 AM

## 2016-06-29 LAB — VITAMIN D 25 HYDROXY (VIT D DEFICIENCY, FRACTURES): Vit D, 25-Hydroxy: 35 ng/mL (ref 30–100)

## 2016-08-02 ENCOUNTER — Encounter: Payer: Self-pay | Admitting: Family Medicine

## 2016-08-02 ENCOUNTER — Ambulatory Visit (INDEPENDENT_AMBULATORY_CARE_PROVIDER_SITE_OTHER): Payer: Commercial Managed Care - HMO | Admitting: Family Medicine

## 2016-08-02 VITALS — BP 123/75 | HR 61 | Temp 97.7°F | Resp 15 | Ht 68.0 in | Wt 203.3 lb

## 2016-08-02 DIAGNOSIS — E785 Hyperlipidemia, unspecified: Secondary | ICD-10-CM | POA: Diagnosis not present

## 2016-08-02 DIAGNOSIS — R12 Heartburn: Secondary | ICD-10-CM | POA: Diagnosis not present

## 2016-08-02 DIAGNOSIS — J449 Chronic obstructive pulmonary disease, unspecified: Secondary | ICD-10-CM

## 2016-08-02 DIAGNOSIS — E781 Pure hyperglyceridemia: Secondary | ICD-10-CM

## 2016-08-02 MED ORDER — RANITIDINE HCL 150 MG PO TABS: 150.0000 mg | ORAL_TABLET | Freq: Two times a day (BID) | ORAL | 1 refills | Status: DC

## 2016-08-02 MED ORDER — ROSUVASTATIN CALCIUM 5 MG PO TABS: 5.0000 mg | ORAL_TABLET | Freq: Every day | ORAL | 0 refills | Status: DC

## 2016-08-02 MED ORDER — FLUTICASONE-SALMETEROL 250-50 MCG/DOSE IN AEPB: 1.0000 | INHALATION_SPRAY | Freq: Two times a day (BID) | RESPIRATORY_TRACT | 0 refills | Status: DC

## 2016-08-02 NOTE — Progress Notes (Signed)
Name: Andrew Mccarthy   MRN: [id number]    DOB: 02-21-1951   Date:08/02/2016       Progress Note  Subjective  Chief Complaint  Chief Complaint  Patient presents with  . Follow-up    discuss starting statin therapy    Hyperlipidemia  This is a chronic problem. The problem is uncontrolled. Recent lipid tests were reviewed and are high. Associated symptoms include shortness of breath. Current antihyperlipidemic treatment includes diet change.  Heartburn  He complains of heartburn and wheezing. He reports no abdominal pain, no coughing, no dysphagia or no sore throat. This is a chronic problem. The problem occurs frequently. Pertinent negatives include no weight loss. Risk factors include smoking/tobacco exposure. He has tried a histamine-2 antagonist for the symptoms. The treatment provided significant relief.     Past Medical History:  Diagnosis Date  . COPD (chronic obstructive pulmonary disease) ([place])   . ED (erectile dysfunction)   . History of gastritis    not currently an issue  . 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 History of kidney stones last 2013  . Hyperlipidemia   . Hypogonadism male   . Smoker   . Vitamin D deficiency     Past Surgical History:  Procedure Laterality Date  . CARDIOVASCULAR STRESS TEST  2010   myoview, WNL  . COLONOSCOPY  09/2007   WNL, rpt 10 yrs [name]) [place]  . ESOPHAGOGASTRODUODENOSCOPY  09/2007   WNL,   . Kidney Stone Retraction  1993  . LITHOTRIPSY  01/2012  . PILONIDAL CYST EXCISION  2003    Family History  Problem Relation Age of Onset  . Cancer Father 84    prostate  . Cancer Sister     breast  . Rheum arthritis Sister   . Alzheimer's disease Mother   . Cancer Son 72    colon  . Coronary artery disease Neg Hx   . Stroke Neg Hx   . Diabetes Neg Hx     Social History   Social History  . Marital status: Married    Spouse name: N/A  . Number of children: N/A  . Years of education: N/A   Occupational History  .  Not on file.   Social History Main Topics  . Smoking status: Former Smoker    Packs/day: 2.00    Years: 35.00    Types: Cigarettes    Quit date: 09/21/2015  . Smokeless tobacco: Never Used  . Alcohol use No  . Drug use: No  . Sexual activity: No   Other Topics Concern  . Not on file   Social History Narrative   Caffeine: 2 cups coffee/day, 4-5 mountain dew/day.   Lives with wife [name])   Occupation: Games developer, maintenance with [employer]   Edu: HS   Activity: no regular exercise, stays active at job   Diet: fruits/vegetables occasionally, lots of mountain dew     Current Outpatient Prescriptions:  .  albuterol (PROVENTIL HFA;VENTOLIN HFA) 108 (90 Base) MCG/ACT inhaler, Inhale 2 puffs into the lungs every 6 (six) hours as needed for wheezing or shortness of breath., Disp: 1 Inhaler, Rfl: 0 .  aspirin 81 MG tablet, Take 81 mg by mouth daily., Disp: , Rfl:  .  cholecalciferol (VITAMIN D) 1000 UNITS tablet, Take 1,000 Units by mouth daily. Reported on 01/19/2016, Disp: , Rfl:  .  fluticasone (FLONASE) 50 MCG/ACT nasal spray, USE 2 SPRAYS EACH NOSTRIL EVERY DAY AS NEEDED., Disp: , Rfl: 3 .  Fluticasone-Salmeterol (  ADVAIR) 250-50 MCG/DOSE AEPB, Inhale 1 puff into the lungs 2 (two) times daily as needed. , Disp: , Rfl:  .  Icosapent Ethyl (VASCEPA) 1 g CAPS, Take 2 capsules by mouth 2 (two) times daily with a meal. (Patient taking differently: Take 2 capsules by mouth 2 (two) times daily. ), Disp: 120 capsule, Rfl: 2 .  ranitidine (ZANTAC) 150 MG tablet, Take 1 tablet (150 mg total) by mouth daily after supper. (Patient taking differently: Take 150 mg by mouth daily as needed. ), Disp: 30 tablet, Rfl: 0 .  Vitamin D, Ergocalciferol, (DRISDOL) 50000 units CAPS capsule, Take 1 capsule (50,000 Units total) by mouth once a week. For 12 weeks (Patient not taking: Reported on 08/02/2016), Disp: 12 capsule, Rfl: 0  No Known Allergies   Review of Systems  Constitutional: Negative for chills,  fever, malaise/fatigue and weight loss.  HENT: Negative for sore throat.   Respiratory: Positive for shortness of breath and wheezing. Negative for cough.   Gastrointestinal: Positive for heartburn. Negative for abdominal pain, blood in stool, dysphagia and vomiting.    Objective  Vitals:   08/02/16 1348  BP: 123/75  Pulse: 61  Resp: 15  Temp: 97.7 F (36.5 C)  TempSrc: Oral  SpO2: 96%  Weight: 203 lb 4.8 oz (92.2 kg)  Height: 5\' 8"  (1.727 m)    Physical Exam  Constitutional: He is well-developed, well-nourished, and in no distress.  HENT:  Head: Normocephalic and atraumatic.  Cardiovascular: Normal rate, regular rhythm, S1 normal, S2 normal and normal heart sounds.   No murmur heard. Pulmonary/Chest: Effort normal and breath sounds normal. He has no wheezes.  Abdominal: Soft. Bowel sounds are normal. There is no tenderness.  Psychiatric: Mood, memory, affect and judgment normal.  Nursing note and vitals reviewed.     Assessment & Plan  1. Hypertriglyceridemia Elevated triglycerides, below normal HDL. Started on statin therapy - rosuvastatin (CRESTOR) 5 MG tablet; Take 1 tablet (5 mg total) by mouth at bedtime.  Dispense: 90 tablet; Refill: 0  2. Dyslipidemia 10 year risk of CVD estimated at 11%, started on statin therapy for primary prevention - rosuvastatin (CRESTOR) 5 MG tablet; Take 1 tablet (5 mg total) by mouth at bedtime.  Dispense: 90 tablet; Refill: 0  3. Heartburn Use ranitidine twice daily to relieve heartburn - ranitidine (ZANTAC) 150 MG tablet; Take 1 tablet (150 mg total) by mouth 2 (two) times daily.  Dispense: 180 tablet; Refill: 1  4. Chronic obstructive pulmonary disease, unspecified COPD type ([place]) COPD secondary to long history of smoking, continue on Advair. Refills provided - Fluticasone-Salmeterol (ADVAIR) 250-50 MCG/DOSE AEPB; Inhale 1 puff into the lungs 2 (two) times daily.  Dispense: 14 each; Refill: 0 me.  Dispense: 90 tablet; Refill:  0   Ignacia Gentzler Asad A[facility] 08/02/2016 2:00 PM

## 2016-08-28 ENCOUNTER — Other Ambulatory Visit: Payer: Self-pay | Admitting: Family Medicine

## 2016-08-28 DIAGNOSIS — J449 Chronic obstructive pulmonary disease, unspecified: Secondary | ICD-10-CM

## 2016-09-21 ENCOUNTER — Encounter: Payer: Self-pay | Admitting: Family Medicine

## 2016-09-21 ENCOUNTER — Ambulatory Visit (INDEPENDENT_AMBULATORY_CARE_PROVIDER_SITE_OTHER): Payer: Commercial Managed Care - HMO | Admitting: Family Medicine

## 2016-09-21 VITALS — BP 124/84 | HR 91 | Temp 98.1°F | Resp 18 | Ht 68.0 in | Wt 201.5 lb

## 2016-09-21 DIAGNOSIS — E781 Pure hyperglyceridemia: Secondary | ICD-10-CM | POA: Diagnosis not present

## 2016-09-21 DIAGNOSIS — J3089 Other allergic rhinitis: Secondary | ICD-10-CM | POA: Diagnosis not present

## 2016-09-21 DIAGNOSIS — Z720 Tobacco use: Secondary | ICD-10-CM

## 2016-09-21 DIAGNOSIS — J449 Chronic obstructive pulmonary disease, unspecified: Secondary | ICD-10-CM

## 2016-09-21 MED ORDER — VARENICLINE TARTRATE 1 MG PO TABS: 1.0000 mg | ORAL_TABLET | Freq: Two times a day (BID) | ORAL | 0 refills | Status: DC

## 2016-09-21 MED ORDER — FLUTICASONE-SALMETEROL 250-50 MCG/DOSE IN AEPB: 1.0000 | INHALATION_SPRAY | Freq: Two times a day (BID) | RESPIRATORY_TRACT | 0 refills | Status: DC

## 2016-09-21 MED ORDER — FLUTICASONE PROPIONATE 50 MCG/ACT NA SUSP: 2.0000 | Freq: Every day | NASAL | 2 refills | Status: DC

## 2016-09-21 MED ORDER — VARENICLINE TARTRATE 0.5 MG X 11 & 1 MG X 42 PO MISC: ORAL | 0 refills | Status: DC

## 2016-09-21 NOTE — Progress Notes (Signed)
Name: Andrew Mccarthy   MRN: [id number]    DOB: [birth year]   Date:09/21/2016       Progress Note  Subjective  Chief Complaint  Chief Complaint  Patient presents with  . Hyperlipidemia    3 month follow up    Hyperlipidemia  This is a chronic problem. The problem is uncontrolled. Recent lipid tests were reviewed and are high. Pertinent negatives include no chest pain, leg pain, myalgias or shortness of breath. Current antihyperlipidemic treatment includes statins. Compliance problems: sometimes he forgets to take his medications.   Nicotine Dependence  Presents for initial visit. Symptoms include cravings. Preferred tobacco types include cigarettes. Preferred brands include [place]. His urge triggers include company of smokers, drinking coffee, driving, meal time and perceived media message about smoking. He smokes < 1/2 a pack of cigarettes per day. Past treatments include varenicline. The treatment provided significant (quit smoking for 5 months.) relief.    Past Medical History:  Diagnosis Date  . COPD (chronic obstructive pulmonary disease) ([place])   . ED (erectile dysfunction)   . History of gastritis    not currently an issue  . 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 History of kidney stones last 2013  . Hyperlipidemia   . Hypogonadism male   . Smoker   . Vitamin D deficiency     Past Surgical History:  Procedure Laterality Date  . CARDIOVASCULAR STRESS TEST  2010   myoview, WNL  . COLONOSCOPY  09/2007   WNL, rpt 10 yrs [name]) [place]  . ESOPHAGOGASTRODUODENOSCOPY  09/2007   WNL,   . Kidney Stone Retraction  1993  . LITHOTRIPSY  01/2012  . PILONIDAL CYST EXCISION  2003    Family History  Problem Relation Age of Onset  . Cancer Father 51    prostate  . Cancer Sister     breast  . Rheum arthritis Sister   . Alzheimer's disease Mother   . Cancer Son 37    colon  . Coronary artery disease Neg Hx   . Stroke Neg Hx   . Diabetes Neg Hx     Social History    Social History  . Marital status: Married    Spouse name: N/A  . Number of children: N/A  . Years of education: N/A   Occupational History  . Not on file.   Social History Main Topics  . Smoking status: Former Smoker    Packs/day: 2.00    Years: 35.00    Types: Cigarettes    Quit date: 09/21/2015  . Smokeless tobacco: Never Used  . Alcohol use No  . Drug use: No  . Sexual activity: No   Other Topics Concern  . Not on file   Social History Narrative   Caffeine: 2 cups coffee/day, 4-5 mountain dew/day.   Lives with wife [name])   Occupation: Games developer, maintenance with [employer]   Edu: HS   Activity: no regular exercise, stays active at job   Diet: fruits/vegetables occasionally, lots of mountain dew     Current Outpatient Prescriptions:  .  ADVAIR DISKUS 250-50 MCG/DOSE AEPB, INHALE 1 PUFF INTO THE LUNGS 2 (TWO) TIMES DAILY., Disp: 60 each, Rfl: 0 .  albuterol (PROVENTIL HFA;VENTOLIN HFA) 108 (90 Base) MCG/ACT inhaler, Inhale 2 puffs into the lungs every 6 (six) hours as needed for wheezing or shortness of breath., Disp: 1 Inhaler, Rfl: 0 .  aspirin 81 MG tablet, Take 81 mg by mouth daily., Disp: , Rfl:  .  cholecalciferol (VITAMIN D) 1000 UNITS tablet, Take 1,000 Units by mouth daily. Reported on 01/19/2016, Disp: , Rfl:  .  fluticasone (FLONASE) 50 MCG/ACT nasal spray, USE 2 SPRAYS EACH NOSTRIL EVERY DAY AS NEEDED., Disp: , Rfl: 3 .  Icosapent Ethyl (VASCEPA) 1 g CAPS, Take 2 capsules by mouth 2 (two) times daily with a meal. (Patient taking differently: Take 2 capsules by mouth 2 (two) times daily. ), Disp: 120 capsule, Rfl: 2 .  ranitidine (ZANTAC) 150 MG tablet, Take 1 tablet (150 mg total) by mouth 2 (two) times daily., Disp: 180 tablet, Rfl: 1 .  rosuvastatin (CRESTOR) 5 MG tablet, Take 1 tablet (5 mg total) by mouth at bedtime., Disp: 90 tablet, Rfl: 0  No Known Allergies   Review of Systems  Constitutional: Negative for chills and fever.  HENT: Positive for  congestion.   Respiratory: Positive for cough. Negative for shortness of breath.   Cardiovascular: Negative for chest pain.  Gastrointestinal: Negative for abdominal pain.  Musculoskeletal: Negative for myalgias.     Objective  Vitals:   09/21/16 0945  BP: 124/84  Pulse: 91  Resp: 18  Temp: 98.1 F (36.7 C)  TempSrc: Oral  SpO2: 96%  Weight: 201 lb 8 oz (91.4 kg)  Height: 5\' 8"  (1.727 m)    Physical Exam  Constitutional: He is oriented to person, place, and time and well-developed, well-nourished, and in no distress.  HENT:  Nose: Right sinus exhibits no maxillary sinus tenderness and no frontal sinus tenderness. Left sinus exhibits no maxillary sinus tenderness and no frontal sinus tenderness.  Nasal mucosal inflammation, turbinate hypertrophy.  Cardiovascular: Normal rate, regular rhythm, S1 normal, S2 normal and normal heart sounds.   No murmur heard. Pulmonary/Chest: Effort normal and breath sounds normal. He has no wheezes.  Neurological: He is alert and oriented to person, place, and time.  Nursing note and vitals reviewed.    Assessment & Plan  1. Chronic obstructive pulmonary disease, unspecified COPD type ([place]) Continue on Advair, advised to use regularly - Fluticasone-Salmeterol (ADVAIR DISKUS) 250-50 MCG/DOSE AEPB; Inhale 1 puff into the lungs 2 (two) times daily.  Dispense: 3 each; Refill: 0  2. Tobacco abuse Patient has restarted smoking, he stayed quit for 5 months. We'll restart on Chantix starting and continuing month pack - varenicline (CHANTIX STARTING MONTH PAK) 0.5 MG X 11 & 1 MG X 42 tablet; Take one 0.5 mg tablet by mouth once daily for 3 days, then increase to one 0.5 mg tablet twice daily for 4 days, then increase to one 1 mg tablet twice daily.  Dispense: 53 tablet; Refill: 0 - varenicline (CHANTIX CONTINUING MONTH PAK) 1 MG tablet; Take 1 tablet (1 mg total) by mouth 2 (two) times daily.  Dispense: 120 tablet; Refill: 0  3.  Hypertriglyceridemia  - Lipid Profile  4. Chronic allergic rhinitis due to other allergic trigger, unspecified seasonality Uses Flonase for nasal congestion and sinus drainage. - fluticasone (FLONASE) 50 MCG/ACT nasal spray; Place 2 sprays into both nostrils daily.  Dispense: 16 g; Refill: 2   Ryonna Cimini [facility] 09/21/2016 9:48 AM

## 2016-10-29 ENCOUNTER — Other Ambulatory Visit: Payer: Self-pay | Admitting: Family Medicine

## 2016-10-29 DIAGNOSIS — E785 Hyperlipidemia, unspecified: Secondary | ICD-10-CM

## 2016-10-29 DIAGNOSIS — E781 Pure hyperglyceridemia: Secondary | ICD-10-CM

## 2016-11-08 ENCOUNTER — Telehealth: Payer: Self-pay | Admitting: Family Medicine

## 2016-11-08 DIAGNOSIS — B001 Herpesviral vesicular dermatitis: Secondary | ICD-10-CM

## 2016-11-08 NOTE — Telephone Encounter (Signed)
Pt had a virus over the holiday and pt now has 6 fever blisters and is asking if you could call in Zovirax for fever blisters. Pt is currently now taking the generic tablet orally. [facility].

## 2016-11-10 DIAGNOSIS — B001 Herpesviral vesicular dermatitis: Secondary | ICD-10-CM | POA: Insufficient documentation

## 2016-11-10 MED ORDER — ACYCLOVIR 400 MG PO TABS: 400.0000 mg | ORAL_TABLET | Freq: Three times a day (TID) | ORAL | 0 refills | Status: DC

## 2016-11-10 NOTE — Assessment & Plan Note (Signed)
Patient experiencing fever blisters, has taken Zovirax in the past, will start on Zovirax 400 mg by mouth 3 times a day 10 days. Prescription will be sent to pharmacy

## 2016-11-11 ENCOUNTER — Telehealth: Payer: Self-pay | Admitting: Family Medicine

## 2016-11-11 DIAGNOSIS — B001 Herpesviral vesicular dermatitis: Secondary | ICD-10-CM

## 2016-11-11 MED ORDER — ACYCLOVIR 5 % EX CREA: 1.0000 "application " | TOPICAL_CREAM | Freq: Four times a day (QID) | CUTANEOUS | 0 refills | Status: DC | PRN

## 2016-11-11 NOTE — Telephone Encounter (Signed)
Prescription for acyclovir cream sent to pharmacy

## 2016-11-11 NOTE — Telephone Encounter (Signed)
Patient was prescribed acyclovir for his fever blisters however he is needing the cream. Patient states it was a miscommunication, patient already had the tablets and his is requesting the cream or ointment for the crackling and scabbing. [email or web address]

## 2016-11-11 NOTE — Telephone Encounter (Signed)
Pt informed

## 2016-11-11 NOTE — Telephone Encounter (Signed)
Patient informed. 

## 2016-12-07 ENCOUNTER — Telehealth: Payer: Self-pay | Admitting: Family Medicine

## 2016-12-07 NOTE — Telephone Encounter (Signed)
Returned call and explained that fatty liver happens when the liver is natural cells are  turned into fat cells, it is mostly seen in individuals who are obese, overweight, or have diabetes or other metabolic abnormalities. Advised on a healthy lifestyle including dietary modifications and increasing physical activity. Will review the CT scan report in detail at his next appointment. Patient was reassured.

## 2016-12-07 NOTE — Telephone Encounter (Signed)
Pt requesting return call. States he had CT of the lungs checking for COPD. When patient saw Dr Raul Del (a specialist) the doctor mentioned that he had a fatty liver when he was looking at the CT report. However Dr Raul Del did not elaborate on this Pt would like to know if he should be concerned? Please return call [phone]

## 2016-12-08 NOTE — Telephone Encounter (Signed)
Dr. Manuella Ghazi has returned patient called and this is what was stated: Returned call and explained that fatty liver happens when the liver is natural cells are  turned into fat cells, it is mostly seen in individuals who are obese, overweight, or have diabetes or other metabolic abnormalities. Advised on a healthy lifestyle including dietary modifications and increasing physical activity. Will review the CT scan report in detail at his next appointment. Patient was reassured.

## 2016-12-22 ENCOUNTER — Encounter: Payer: Commercial Managed Care - HMO | Admitting: Family Medicine

## 2016-12-31 ENCOUNTER — Other Ambulatory Visit: Payer: Self-pay | Admitting: Family Medicine

## 2016-12-31 DIAGNOSIS — J449 Chronic obstructive pulmonary disease, unspecified: Secondary | ICD-10-CM

## 2017-01-03 ENCOUNTER — Ambulatory Visit (INDEPENDENT_AMBULATORY_CARE_PROVIDER_SITE_OTHER): Payer: Commercial Managed Care - HMO | Admitting: Family Medicine

## 2017-01-03 ENCOUNTER — Encounter: Payer: Self-pay | Admitting: Family Medicine

## 2017-01-03 ENCOUNTER — Other Ambulatory Visit: Payer: Self-pay | Admitting: Family Medicine

## 2017-01-03 VITALS — BP 124/80 | HR 81 | Temp 97.2°F | Resp 17 | Ht 68.0 in | Wt 199.9 lb

## 2017-01-03 DIAGNOSIS — Z0000 Encounter for general adult medical examination without abnormal findings: Secondary | ICD-10-CM

## 2017-01-03 NOTE — Progress Notes (Signed)
Name: Andrew Mccarthy   MRN: [id number]    DOB: 1951-09-13   Date:01/03/2017       Progress Note  Subjective  Chief Complaint  Chief Complaint  Patient presents with  . Annual Exam    CPE    HPI  Pt. Presents for a Complete Physical Exam. Last Colonosocpy was November 2008, due in November 2018. PSA was normal last year.   Past Medical History:  Diagnosis Date  . COPD (chronic obstructive pulmonary disease) ([place])   . ED (erectile dysfunction)   . History of gastritis    not currently an issue  . 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 History of kidney stones last 2013  . Hyperlipidemia   . Hypogonadism male   . Smoker   . Vitamin D deficiency     Past Surgical History:  Procedure Laterality Date  . CARDIOVASCULAR STRESS TEST  2010   myoview, WNL  . COLONOSCOPY  09/2007   WNL, rpt 10 yrs [name]) [place]  . ESOPHAGOGASTRODUODENOSCOPY  09/2007   WNL,   . Kidney Stone Retraction  1993  . LITHOTRIPSY  01/2012  . PILONIDAL CYST EXCISION  2003    Family History  Problem Relation Age of Onset  . Cancer Father 45    prostate  . Cancer Sister     breast  . Rheum arthritis Sister   . Alzheimer's disease Mother   . Cancer Son 63    colon  . Coronary artery disease Neg Hx   . Stroke Neg Hx   . Diabetes Neg Hx     Social History   Social History  . Marital status: Married    Spouse name: N/A  . Number of children: N/A  . Years of education: N/A   Occupational History  . Not on file.   Social History Main Topics  . Smoking status: Former Smoker    Packs/day: 2.00    Years: 35.00    Types: Cigarettes    Quit date: 09/21/2015  . Smokeless tobacco: Never Used  . Alcohol use No  . Drug use: No  . Sexual activity: No   Other Topics Concern  . Not on file   Social History Narrative   Caffeine: 2 cups coffee/day, 4-5 mountain dew/day.   Lives with wife [name])   Occupation: Games developer, maintenance with [employer]   Edu: HS   Activity: no regular  exercise, stays active at job   Diet: fruits/vegetables occasionally, lots of mountain dew     Current Outpatient Prescriptions:  .  ADVAIR DISKUS 250-50 MCG/DOSE AEPB, INHALE 1 PUFF INTO THE LUNGS 2 (TWO) TIMES DAILY., Disp: 180 each, Rfl: 0 .  albuterol (PROVENTIL HFA;VENTOLIN HFA) 108 (90 Base) MCG/ACT inhaler, Inhale 2 puffs into the lungs every 6 (six) hours as needed for wheezing or shortness of breath., Disp: 1 Inhaler, Rfl: 0 .  aspirin 81 MG tablet, Take 81 mg by mouth daily., Disp: , Rfl:  .  cholecalciferol (VITAMIN D) 1000 UNITS tablet, Take 1,000 Units by mouth daily. Reported on 01/19/2016, Disp: , Rfl:  .  fluticasone (FLONASE) 50 MCG/ACT nasal spray, Place 2 sprays into both nostrils daily., Disp: 16 g, Rfl: 2 .  Icosapent Ethyl (VASCEPA) 1 g CAPS, Take 2 capsules by mouth 2 (two) times daily with a meal. (Patient taking differently: Take 2 capsules by mouth 2 (two) times daily. ), Disp: 120 capsule, Rfl: 2 .  ranitidine (ZANTAC) 150 MG tablet, Take 1 tablet (150 mg  total) by mouth 2 (two) times daily., Disp: 180 tablet, Rfl: 1 .  rosuvastatin (CRESTOR) 5 MG tablet, TAKE 1 TABLET (5 MG TOTAL) BY MOUTH AT BEDTIME., Disp: 90 tablet, Rfl: 0 .  varenicline (CHANTIX CONTINUING MONTH PAK) 1 MG tablet, Take 1 tablet (1 mg total) by mouth 2 (two) times daily., Disp: 120 tablet, Rfl: 0 .  varenicline (CHANTIX STARTING MONTH PAK) 0.5 MG X 11 & 1 MG X 42 tablet, Take one 0.5 mg tablet by mouth once daily for 3 days, then increase to one 0.5 mg tablet twice daily for 4 days, then increase to one 1 mg tablet twice daily., Disp: 53 tablet, Rfl: 0 .  acyclovir cream (ZOVIRAX) 5 %, Apply 1 application topically every 6 (six) hours as needed. (Patient not taking: Reported on 01/03/2017), Disp: 15 g, Rfl: 0  No Known Allergies   Review of Systems  Constitutional: Negative for chills, fever, malaise/fatigue and weight loss.  HENT: Negative for congestion, ear pain and sore throat.   Eyes: Negative  for blurred vision and double vision.  Respiratory: Negative for cough, sputum production, shortness of breath and wheezing.   Cardiovascular: Negative for chest pain and leg swelling.  Gastrointestinal: Negative for abdominal pain, blood in stool, constipation, diarrhea, nausea and vomiting.  Genitourinary: Negative for dysuria and hematuria.  Musculoskeletal: Negative for back pain, joint pain, myalgias and neck pain.  Neurological: Negative for dizziness and headaches.  Psychiatric/Behavioral: Negative for depression. The patient is not nervous/anxious and does not have insomnia.     Objective  Vitals:   01/03/17 1050  BP: 124/80  Pulse: 81  Resp: 17  Temp: 97.2 F (36.2 C)  TempSrc: Oral  SpO2: 94%  Weight: 199 lb 14.4 oz (90.7 kg)  Height: 5\' 8"  (1.727 m)    Physical Exam  Constitutional: He is oriented to person, place, and time and well-developed, well-nourished, and in no distress.  HENT:  Head: Normocephalic and atraumatic.  Right Ear: Tympanic membrane and ear canal normal.  Left Ear: Tympanic membrane and ear canal normal.  Mouth/Throat: No posterior oropharyngeal erythema.  Cardiovascular: Normal rate, regular rhythm and normal heart sounds.   No murmur heard. Pulmonary/Chest: Effort normal and breath sounds normal. He has no wheezes.  Abdominal: Soft. Bowel sounds are normal. There is no tenderness.  Genitourinary: Prostate normal. Prostate is not enlarged and not tender.  Neurological: He is alert and oriented to person, place, and time.  Psychiatric: Mood, memory, affect and judgment normal.  Nursing note and vitals reviewed.      Assessment & Plan  1. Annual physical exam Obtain age-appropriate laboratory screenings. - CBC with Differential/Platelet - COMPLETE METABOLIC PANEL WITH GFR - TSH - VITAMIN D 25 Hydroxy (Vit-D Deficiency, Fractures) - PSA   Franchot Pollitt [facility] 01/03/2017 11:04 AM

## 2017-01-04 LAB — PSA: PROSTATE SPECIFIC AG, SERUM: 1 ng/mL (ref 0.0–4.0)

## 2017-01-04 LAB — CBC WITH DIFFERENTIAL/PLATELET
Basophils Absolute: 0.1 10*3/uL (ref 0.0–0.2)
Basos: 1 %
EOS (ABSOLUTE): 0.2 10*3/uL (ref 0.0–0.4)
EOS: 2 %
Hematocrit: 44.7 % (ref 37.5–51.0)
Hemoglobin: 15.3 g/dL (ref 13.0–17.7)
IMMATURE GRANS (ABS): 0.1 10*3/uL (ref 0.0–0.1)
IMMATURE GRANULOCYTES: 1 %
LYMPHS: 41 %
Lymphocytes Absolute: 3.6 10*3/uL — ABNORMAL HIGH (ref 0.7–3.1)
MCH: 30.9 pg (ref 26.6–33.0)
MCHC: 34.2 g/dL (ref 31.5–35.7)
MCV: 90 fL (ref 79–97)
MONOS ABS: 0.8 10*3/uL (ref 0.1–0.9)
Monocytes: 9 %
NEUTROS PCT: 46 %
Neutrophils Absolute: 4.1 10*3/uL (ref 1.4–7.0)
PLATELETS: 249 10*3/uL (ref 150–379)
RBC: 4.95 x10E6/uL (ref 4.14–5.80)
RDW: 14.5 % (ref 12.3–15.4)
WBC: 8.9 10*3/uL (ref 3.4–10.8)

## 2017-01-04 LAB — COMPREHENSIVE METABOLIC PANEL
A/G RATIO: 1.4 (ref 1.2–2.2)
ALT: 43 IU/L (ref 0–44)
AST: 31 IU/L (ref 0–40)
Albumin: 4.2 g/dL (ref 3.6–4.8)
Alkaline Phosphatase: 133 IU/L — ABNORMAL HIGH (ref 39–117)
BILIRUBIN TOTAL: 0.4 mg/dL (ref 0.0–1.2)
BUN/Creatinine Ratio: 14 (ref 10–24)
BUN: 14 mg/dL (ref 8–27)
CALCIUM: 9.5 mg/dL (ref 8.6–10.2)
CHLORIDE: 101 mmol/L (ref 96–106)
CO2: 24 mmol/L (ref 18–29)
Creatinine, Ser: 1 mg/dL (ref 0.76–1.27)
GFR calc non Af Amer: 80 mL/min/{1.73_m2} (ref >59)
GFR, EST AFRICAN AMERICAN: 92 mL/min/{1.73_m2} (ref >59)
GLUCOSE: 110 mg/dL — AB (ref 65–99)
Globulin, Total: 3 g/dL (ref 1.5–4.5)
POTASSIUM: 4.2 mmol/L (ref 3.5–5.2)
Sodium: 139 mmol/L (ref 134–144)
TOTAL PROTEIN: 7.2 g/dL (ref 6.0–8.5)

## 2017-01-04 LAB — TSH: TSH: 2.37 u[IU]/mL (ref 0.450–4.500)

## 2017-01-04 LAB — VITAMIN D 25 HYDROXY (VIT D DEFICIENCY, FRACTURES): VIT D 25 HYDROXY: 25.9 ng/mL — AB (ref 30.0–100.0)

## 2017-01-10 ENCOUNTER — Other Ambulatory Visit: Payer: Self-pay | Admitting: Emergency Medicine

## 2017-01-10 MED ORDER — VITAMIN D (ERGOCALCIFEROL) 1.25 MG (50000 UNIT) PO CAPS: 50000.0000 [IU] | ORAL_CAPSULE | ORAL | 0 refills | Status: DC

## 2017-02-04 ENCOUNTER — Other Ambulatory Visit: Payer: Self-pay | Admitting: Family Medicine

## 2017-02-04 DIAGNOSIS — R12 Heartburn: Secondary | ICD-10-CM

## 2017-02-05 ENCOUNTER — Other Ambulatory Visit: Payer: Self-pay | Admitting: Family Medicine

## 2017-02-05 DIAGNOSIS — E785 Hyperlipidemia, unspecified: Secondary | ICD-10-CM

## 2017-02-05 DIAGNOSIS — E781 Pure hyperglyceridemia: Secondary | ICD-10-CM

## 2017-04-01 ENCOUNTER — Other Ambulatory Visit: Payer: Self-pay | Admitting: Family Medicine

## 2017-04-12 ENCOUNTER — Other Ambulatory Visit: Payer: Self-pay | Admitting: Family Medicine

## 2017-04-12 DIAGNOSIS — J449 Chronic obstructive pulmonary disease, unspecified: Secondary | ICD-10-CM

## 2017-06-16 ENCOUNTER — Telehealth: Payer: Self-pay | Admitting: *Deleted

## 2017-06-16 NOTE — Telephone Encounter (Signed)
Left message for patient to notify them that it is time to schedule annual low dose lung cancer screening CT scan. Instructed patient to call back to verify information prior to the scan being scheduled.  

## 2017-06-28 ENCOUNTER — Telehealth: Payer: Self-pay | Admitting: *Deleted

## 2017-06-28 NOTE — Telephone Encounter (Signed)
Left message for patient to notify them that it is time to schedule annual low dose lung cancer screening CT scan. Instructed patient to call back to verify information prior to the scan being scheduled.  

## 2017-06-29 ENCOUNTER — Telehealth: Payer: Self-pay | Admitting: *Deleted

## 2017-06-29 DIAGNOSIS — Z87891 Personal history of nicotine dependence: Secondary | ICD-10-CM

## 2017-06-29 DIAGNOSIS — Z122 Encounter for screening for malignant neoplasm of respiratory organs: Secondary | ICD-10-CM

## 2017-06-29 NOTE — Telephone Encounter (Signed)
Notified patient that annual lung cancer screening low dose CT scan is due currently or will be in near future. Confirmed that patient is within the age range of 55-77, and asymptomatic, (no signs or symptoms of lung cancer). Patient denies illness that would prevent curative treatment for lung cancer if found. Verified smoking history, (former, quit 09/2015, 70 pack year). The shared decision making visit was done 06/21/16. Patient is agreeable for CT scan being scheduled.

## 2017-07-06 ENCOUNTER — Ambulatory Visit
Admission: RE | Admit: 2017-07-06 | Discharge: 2017-07-06 | Disposition: A | Discharge status: Home / Self Care | Payer: 59 | Source: Ambulatory Visit | Attending: Oncology | Admitting: Oncology

## 2017-07-06 DIAGNOSIS — D3501 Benign neoplasm of right adrenal gland: Secondary | ICD-10-CM | POA: Diagnosis not present

## 2017-07-06 DIAGNOSIS — J438 Other emphysema: Secondary | ICD-10-CM | POA: Diagnosis not present

## 2017-07-06 DIAGNOSIS — Z87891 Personal history of nicotine dependence: Secondary | ICD-10-CM

## 2017-07-06 DIAGNOSIS — J432 Centrilobular emphysema: Secondary | ICD-10-CM | POA: Diagnosis not present

## 2017-07-06 DIAGNOSIS — N62 Hypertrophy of breast: Secondary | ICD-10-CM | POA: Insufficient documentation

## 2017-07-06 DIAGNOSIS — I2510 Atherosclerotic heart disease of native coronary artery without angina pectoris: Secondary | ICD-10-CM | POA: Insufficient documentation

## 2017-07-06 DIAGNOSIS — Z122 Encounter for screening for malignant neoplasm of respiratory organs: Secondary | ICD-10-CM | POA: Diagnosis present

## 2017-07-06 DIAGNOSIS — I700 Atherosclerosis of aorta: Secondary | ICD-10-CM | POA: Insufficient documentation

## 2017-07-11 ENCOUNTER — Encounter: Payer: Self-pay | Admitting: *Deleted

## 2017-07-19 ENCOUNTER — Other Ambulatory Visit: Payer: Self-pay | Admitting: Family Medicine

## 2017-07-19 DIAGNOSIS — J449 Chronic obstructive pulmonary disease, unspecified: Secondary | ICD-10-CM

## 2017-08-23 ENCOUNTER — Encounter: Payer: Self-pay | Admitting: Family Medicine

## 2017-08-25 ENCOUNTER — Encounter: Payer: Self-pay | Admitting: Family Medicine

## 2017-08-25 ENCOUNTER — Ambulatory Visit (INDEPENDENT_AMBULATORY_CARE_PROVIDER_SITE_OTHER): Payer: 59 | Admitting: Family Medicine

## 2017-08-25 VITALS — BP 116/72 | HR 85 | Temp 97.7°F | Resp 16 | Ht 68.0 in | Wt 199.7 lb

## 2017-08-25 DIAGNOSIS — F17200 Nicotine dependence, unspecified, uncomplicated: Secondary | ICD-10-CM

## 2017-08-25 DIAGNOSIS — Z1211 Encounter for screening for malignant neoplasm of colon: Secondary | ICD-10-CM

## 2017-08-25 DIAGNOSIS — E781 Pure hyperglyceridemia: Secondary | ICD-10-CM | POA: Diagnosis not present

## 2017-08-25 NOTE — Progress Notes (Signed)
Name: Andrew Mccarthy   MRN: [id number]    DOB: 1950/11/25   Date:08/25/2017       Progress Note  Subjective  Chief Complaint  Chief Complaint  Patient presents with  . Medication Refill    crestor  . Referral    follow up for colonoscopy to [facility]    HPI  Patient presents to obtain referral for screening colonoscopy, last colonoscopy was done in November 2008, was unremarkable and it's time to repeat his colonoscopy. Patient denies any changes in bowel habits, no family history of colon cancer.  He is also due for repeating fasting lipid panel, last one was ordered in April 2018 but was unable to follow up on that. He is not currently taking rosuvastatin.  He has also gone back to smoking, now smoking approximately 1 pack per day, occasionally uses Chantix in between to help him kick the habit but has been unable to successfully do so.  Past Medical History:  Diagnosis Date  . COPD (chronic obstructive pulmonary disease) ([place])   . ED (erectile dysfunction)   . History of gastritis    not currently an issue  . 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 History of kidney stones last 2013  . Hyperlipidemia   . Hypogonadism male   . Smoker   . Vitamin D deficiency     Past Surgical History:  Procedure Laterality Date  . CARDIOVASCULAR STRESS TEST  2010   myoview, WNL  . COLONOSCOPY  09/2007   WNL, rpt 10 yrs [name]) [place]  . ESOPHAGOGASTRODUODENOSCOPY  09/2007   WNL,   . Kidney Stone Retraction  1993  . LITHOTRIPSY  01/2012  . PILONIDAL CYST EXCISION  2003    Family History  Problem Relation Age of Onset  . Cancer Father 41       prostate  . Cancer Sister        breast  . Rheum arthritis Sister   . Alzheimer's disease Mother   . Cancer Son 49       colon  . Coronary artery disease Neg Hx   . Stroke Neg Hx   . Diabetes Neg Hx     Social History   Social History  . Marital status: Married    Spouse name: N/A  . Number of children: N/A  . Years of  education: N/A   Occupational History  . Not on file.   Social History Main Topics  . Smoking status: Former Smoker    Packs/day: 2.00    Years: 35.00    Types: Cigarettes    Quit date: 09/21/2015  . Smokeless tobacco: Never Used  . Alcohol use No  . Drug use: No  . Sexual activity: No   Other Topics Concern  . Not on file   Social History Narrative   Caffeine: 2 cups coffee/day, 4-5 mountain dew/day.   Lives with wife [name])   Occupation: Games developer, maintenance with [employer]   Edu: HS   Activity: no regular exercise, stays active at job   Diet: fruits/vegetables occasionally, lots of mountain dew     Current Outpatient Prescriptions:  .  aspirin 81 MG tablet, Take 81 mg by mouth daily., Disp: , Rfl:  .  ranitidine (ZANTAC) 150 MG tablet, TAKE 1 TABLET (150 MG TOTAL) BY MOUTH 2 (TWO) TIMES DAILY., Disp: 180 tablet, Rfl: 1 .  acyclovir cream (ZOVIRAX) 5 %, Apply 1 application topically every 6 (six) hours as needed. (Patient not taking: Reported on  08/25/2017), Disp: 15 g, Rfl: 0 .  ADVAIR DISKUS 250-50 MCG/DOSE AEPB, INHALE 1 PUFF INTO THE LUNGS 2 (TWO) TIMES DAILY., Disp: 180 each, Rfl: 0 .  albuterol (PROVENTIL HFA;VENTOLIN HFA) 108 (90 Base) MCG/ACT inhaler, Inhale 2 puffs into the lungs every 6 (six) hours as needed for wheezing or shortness of breath. (Patient not taking: Reported on 08/25/2017), Disp: 1 Inhaler, Rfl: 0 .  cholecalciferol (VITAMIN D) 1000 UNITS tablet, Take 1,000 Units by mouth daily. Reported on 01/19/2016, Disp: , Rfl:  .  fluticasone (FLONASE) 50 MCG/ACT nasal spray, Place 2 sprays into both nostrils daily. (Patient not taking: Reported on 08/25/2017), Disp: 16 g, Rfl: 2 .  Icosapent Ethyl (VASCEPA) 1 g CAPS, Take 2 capsules by mouth 2 (two) times daily with a meal. (Patient not taking: Reported on 08/25/2017), Disp: 120 capsule, Rfl: 2 .  rosuvastatin (CRESTOR) 5 MG tablet, TAKE 1 TABLET (5 MG TOTAL) BY MOUTH AT BEDTIME. (Patient not taking: Reported  on 08/25/2017), Disp: 90 tablet, Rfl: 0 .  varenicline (CHANTIX CONTINUING MONTH PAK) 1 MG tablet, Take 1 tablet (1 mg total) by mouth 2 (two) times daily. (Patient not taking: Reported on 08/25/2017), Disp: 120 tablet, Rfl: 0 .  varenicline (CHANTIX STARTING MONTH PAK) 0.5 MG X 11 & 1 MG X 42 tablet, Take one 0.5 mg tablet by mouth once daily for 3 days, then increase to one 0.5 mg tablet twice daily for 4 days, then increase to one 1 mg tablet twice daily. (Patient not taking: Reported on 08/25/2017), Disp: 53 tablet, Rfl: 0 .  Vitamin D, Ergocalciferol, (DRISDOL) 50000 units CAPS capsule, Take 1 capsule (50,000 Units total) by mouth every 7 (seven) days. (Patient not taking: Reported on 08/25/2017), Disp: 12 capsule, Rfl: 0  No Known Allergies   ROS  Please see history of present illness for complete discussion of ROS  Objective  Vitals:   08/25/17 1138  BP: 116/72  Pulse: 85  Resp: 16  Temp: 97.7 F (36.5 C)  TempSrc: Oral  SpO2: 95%  Weight: 199 lb 11.2 oz (90.6 kg)  Height: 5\' 8"  (3.267 m)    Physical Exam  Constitutional: He is oriented to person, place, and time and well-developed, well-nourished, and in no distress.  HENT:  Head: Normocephalic and atraumatic.  Cardiovascular: Normal rate, regular rhythm and normal heart sounds.   No murmur heard. Pulmonary/Chest: Effort normal and breath sounds normal. He has no wheezes.  Abdominal: Soft. Bowel sounds are normal. He exhibits no distension. There is no tenderness.  Musculoskeletal: He exhibits no edema.  Neurological: He is alert and oriented to person, place, and time.  Psychiatric: Mood, memory, affect and judgment normal.  Nursing note and vitals reviewed.     Assessment & Plan  1. Encounter for screening colonoscopy Referral gastroneurology for screening colonoscopy - Ambulatory referral to Gastroenterology  2. Hypertriglyceridemia Obtain FLP and follow-up, consider restarting statin - Lipid panel -  COMPLETE METABOLIC PANEL WITH GFR  3. Smoker Encouraged to quit smoking, he declined Zyban.   Peter Keyworth [facility] 08/25/2017 11:42 AM

## 2017-11-15 ENCOUNTER — Ambulatory Visit: Payer: 59 | Admitting: Family Medicine

## 2017-11-15 ENCOUNTER — Encounter: Payer: Self-pay | Admitting: Family Medicine

## 2017-11-15 VITALS — BP 118/60 | HR 58 | Temp 97.6°F | Resp 16 | Wt 190.7 lb

## 2017-11-15 DIAGNOSIS — Z720 Tobacco use: Secondary | ICD-10-CM | POA: Diagnosis not present

## 2017-11-15 DIAGNOSIS — J449 Chronic obstructive pulmonary disease, unspecified: Secondary | ICD-10-CM

## 2017-11-15 MED ORDER — VARENICLINE TARTRATE 1 MG PO TABS: 1.0000 mg | ORAL_TABLET | Freq: Two times a day (BID) | ORAL | 0 refills | Status: DC

## 2017-11-15 MED ORDER — VARENICLINE TARTRATE 0.5 MG X 11 & 1 MG X 42 PO MISC: ORAL | 0 refills | Status: DC

## 2017-11-15 MED ORDER — VARENICLINE TARTRATE 1 MG PO TABS: 1.0000 mg | ORAL_TABLET | Freq: Two times a day (BID) | ORAL | 0 refills | Status: AC

## 2017-11-15 NOTE — Progress Notes (Signed)
Name: Andrew Mccarthy   MRN: [id number]    DOB: September 28, 1951   Date:11/15/2017       Progress Note  Subjective  Chief Complaint  Chief Complaint  Patient presents with  . Cough    Due to smoking. Going on for couple of months and would like to chantix to help quit smoking .     Nicotine Dependence  Presents for initial visit. Symptoms include cravings. Preferred tobacco types include cigarettes. Preferred cigarette types include filtered. Preferred strength is light. Preferred cigarettes are non-menthol. Cigarette brand: Maverick. His urge triggers include company of smokers, drinking coffee, driving, meal time and stress. His first smoke is from 8 to 10 AM. He smokes 1 pack of cigarettes per day. He started smoking when he was >67 years old. Past treatments include varenicline. The treatment provided significant relief. Andrew Mccarthy is ready to quit. Andrew Mccarthy has tried to quit 2 times.     Past Medical History:  Diagnosis Date  . COPD (chronic obstructive pulmonary disease) ([place])   . ED (erectile dysfunction)   . History of gastritis    not currently an issue  . 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 History of kidney stones last 2013  . Hyperlipidemia   . Hypogonadism male   . Smoker   . Vitamin D deficiency     Past Surgical History:  Procedure Laterality Date  . CARDIOVASCULAR STRESS TEST  2010   myoview, WNL  . COLONOSCOPY  09/2007   WNL, rpt 10 yrs Andrew Mccarthy) Andrew Mccarthy  . ESOPHAGOGASTRODUODENOSCOPY  09/2007   WNL,   . Kidney Stone Retraction  1993  . LITHOTRIPSY  01/2012  . PILONIDAL CYST EXCISION  2003    Family History  Problem Relation Age of Onset  . Cancer Father 36       prostate  . Cancer Sister        breast  . Rheum arthritis Sister   . Alzheimer's disease Mother   . Cancer Son 73       colon  . Coronary artery disease Neg Hx   . Stroke Neg Hx   . Diabetes Neg Hx     Social History   Socioeconomic History  . Marital status: Married    Spouse name: Not on  file  . Number of children: Not on file  . Years of education: Not on file  . Highest education level: Not on file  Social Needs  . Financial resource strain: Not on file  . Food insecurity - worry: Not on file  . Food insecurity - inability: Not on file  . Transportation needs - medical: Not on file  . Transportation needs - non-medical: Not on file  Occupational History  . Not on file  Tobacco Use  . Smoking status: Former Smoker    Packs/day: 2.00    Years: 35.00    Pack years: 70.00    Types: Cigarettes    Start date: 07/16/2017  . Smokeless tobacco: Never Used  Substance and Sexual Activity  . Alcohol use: No  . Drug use: No  . Sexual activity: No  Other Topics Concern  . Not on file  Social History Narrative   Caffeine: 2 cups coffee/day, 4-5 mountain dew/day.   Lives with wife Andrew Mccarthy)   Occupation: Games developer, maintenance with [employer]   Edu: HS   Activity: no regular exercise, stays active at job   Diet: fruits/vegetables occasionally, lots of mountain dew     Current Outpatient Medications:  .  ADVAIR DISKUS 250-50 MCG/DOSE AEPB, INHALE 1 PUFF INTO THE LUNGS 2 (TWO) TIMES DAILY., Disp: 180 each, Rfl: 0 .  vitamin B-12 (CYANOCOBALAMIN) 500 MCG tablet, Take 1,000 mcg by mouth daily., Disp: , Rfl:  .  acyclovir cream (ZOVIRAX) 5 %, Apply 1 application topically every 6 (six) hours as needed. (Patient not taking: Reported on 08/25/2017), Disp: 15 g, Rfl: 0 .  albuterol (PROVENTIL HFA;VENTOLIN HFA) 108 (90 Base) MCG/ACT inhaler, Inhale 2 puffs into the lungs every 6 (six) hours as needed for wheezing or shortness of breath. (Patient not taking: Reported on 08/25/2017), Disp: 1 Inhaler, Rfl: 0 .  aspirin 81 MG tablet, Take 81 mg by mouth daily., Disp: , Rfl:  .  cholecalciferol (VITAMIN D) 1000 UNITS tablet, Take 1,000 Units by mouth daily. Reported on 01/19/2016, Disp: , Rfl:  .  fluticasone (FLONASE) 50 MCG/ACT nasal spray, Place 2 sprays into both nostrils daily.  (Patient not taking: Reported on 08/25/2017), Disp: 16 g, Rfl: 2 .  Icosapent Ethyl (VASCEPA) 1 g CAPS, Take 2 capsules by mouth 2 (two) times daily with a meal. (Patient not taking: Reported on 08/25/2017), Disp: 120 capsule, Rfl: 2 .  ranitidine (ZANTAC) 150 MG tablet, TAKE 1 TABLET (150 MG TOTAL) BY MOUTH 2 (TWO) TIMES DAILY. (Patient not taking: Reported on 11/15/2017), Disp: 180 tablet, Rfl: 1 .  rosuvastatin (CRESTOR) 5 MG tablet, TAKE 1 TABLET (5 MG TOTAL) BY MOUTH AT BEDTIME. (Patient not taking: Reported on 08/25/2017), Disp: 90 tablet, Rfl: 0 .  varenicline (CHANTIX CONTINUING MONTH PAK) 1 MG tablet, Take 1 tablet (1 mg total) by mouth 2 (two) times daily. (Patient not taking: Reported on 08/25/2017), Disp: 120 tablet, Rfl: 0 .  Vitamin D, Ergocalciferol, (DRISDOL) 50000 units CAPS capsule, Take 1 capsule (50,000 Units total) by mouth every 7 (seven) days. (Patient not taking: Reported on 08/25/2017), Disp: 12 capsule, Rfl: 0  No Known Allergies   ROS  Please see history of present illness for complete discussion of ROS  Objective  Vitals:   11/15/17 0841  BP: 118/60  Pulse: (!) 58  Resp: 16  Temp: 97.6 F (36.4 C)  TempSrc: Oral  SpO2: 98%  Weight: 190 lb 11.2 oz (86.5 kg)    Physical Exam  Constitutional: He is oriented to person, place, and time and well-developed, well-nourished, and in no distress.  HENT:  Head: Normocephalic and atraumatic.  Cardiovascular: Normal rate, regular rhythm and normal heart sounds.  No murmur heard. Pulmonary/Chest: Effort normal and breath sounds normal. He has no wheezes.  Neurological: He is alert and oriented to person, place, and time.  Psychiatric: Mood, memory, affect and judgment normal.  Nursing note and vitals reviewed.    Assessment & Plan  1. Tobacco abuse Patient has relapsed and is now smoking about a pack a day, will be started on Chantix, patient advised to quit within the first 3 weeks of starting treatment,  follow-up in 3 months - varenicline (CHANTIX STARTING MONTH PAK) 0.5 MG X 11 & 1 MG X 42 tablet; Take one 0.5 mg tablet by mouth once daily for 3 days, then increase to one 0.5 mg tablet twice daily for 4 days, then increase to one 1 mg tablet twice daily.  Dispense: 53 tablet; Refill: 0 - varenicline (CHANTIX CONTINUING MONTH PAK) 1 MG tablet; Take 1 tablet (1 mg total) by mouth 2 (two) times daily.  Dispense: 120 tablet; Refill: 0  2. Chronic obstructive pulmonary disease, unspecified COPD type ([place]) He is  on Advair and takes albuterol as needed, lungs are clear, oxygen sats are normal, no symptoms of dyspnea at rest, COPD appears to be at baseline   [employer] [facility] 11/15/2017 8:45 AM

## 2017-12-18 ENCOUNTER — Other Ambulatory Visit: Payer: Self-pay | Admitting: Family Medicine

## 2017-12-18 ENCOUNTER — Encounter: Payer: Self-pay | Admitting: Family Medicine

## 2017-12-18 ENCOUNTER — Ambulatory Visit (INDEPENDENT_AMBULATORY_CARE_PROVIDER_SITE_OTHER): Payer: 59 | Admitting: Family Medicine

## 2017-12-18 VITALS — BP 112/72 | HR 73 | Temp 98.0°F | Resp 18 | Ht 68.0 in | Wt 195.2 lb

## 2017-12-18 DIAGNOSIS — R7303 Prediabetes: Secondary | ICD-10-CM

## 2017-12-18 DIAGNOSIS — Z0000 Encounter for general adult medical examination without abnormal findings: Secondary | ICD-10-CM

## 2017-12-18 DIAGNOSIS — Z1159 Encounter for screening for other viral diseases: Secondary | ICD-10-CM

## 2017-12-18 DIAGNOSIS — Z0001 Encounter for general adult medical examination with abnormal findings: Secondary | ICD-10-CM | POA: Diagnosis not present

## 2017-12-18 DIAGNOSIS — Z125 Encounter for screening for malignant neoplasm of prostate: Secondary | ICD-10-CM

## 2017-12-18 LAB — POCT GLYCOSYLATED HEMOGLOBIN (HGB A1C): HEMOGLOBIN A1C: 5.5

## 2017-12-18 NOTE — Progress Notes (Signed)
Name: Andrew Mccarthy   MRN: [id number]    DOB: 16-Dec-1950   Date:12/18/2017       Progress Note  Subjective  Chief Complaint  Chief Complaint  Patient presents with  . Annual Exam    HPI  Pt. Presents for Complete Physical Exam. He is due for Hep C screening, prostate cancer screening, and is scheduled for colon cancer screening in March 2019.   Past Medical History:  Diagnosis Date  . COPD (chronic obstructive pulmonary disease) ([place])   . ED (erectile dysfunction)   . History of gastritis    not currently an issue  . 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 History of kidney stones last 2013  . Hyperlipidemia   . Hypogonadism male   . Smoker   . Vitamin D deficiency     Past Surgical History:  Procedure Laterality Date  . CARDIOVASCULAR STRESS TEST  2010   myoview, WNL  . COLONOSCOPY  09/2007   WNL, rpt 10 yrs Andrew Mccarthy) Andrew Mccarthy  . ESOPHAGOGASTRODUODENOSCOPY  09/2007   WNL,   . Kidney Stone Retraction  1993  . LITHOTRIPSY  01/2012  . PILONIDAL CYST EXCISION  2003    Family History  Problem Relation Age of Onset  . Cancer Father 70       prostate  . Cancer Sister        breast  . Rheum arthritis Sister   . Alzheimer's disease Mother   . Cancer Son 44       colon  . Coronary artery disease Neg Hx   . Stroke Neg Hx   . Diabetes Neg Hx     Social History   Socioeconomic History  . Marital status: Married    Spouse name: Not on file  . Number of children: Not on file  . Years of education: Not on file  . Highest education level: Not on file  Social Needs  . Financial resource strain: Not on file  . Food insecurity - worry: Not on file  . Food insecurity - inability: Not on file  . Transportation needs - medical: Not on file  . Transportation needs - non-medical: Not on file  Occupational History  . Not on file  Tobacco Use  . Smoking status: Former Smoker    Packs/day: 2.00    Years: 35.00    Pack years: 70.00    Types: Cigarettes    Start  date: 07/16/2017  . Smokeless tobacco: Never Used  Substance and Sexual Activity  . Alcohol use: No  . Drug use: No  . Sexual activity: No  Other Topics Concern  . Not on file  Social History Narrative   Caffeine: 2 cups coffee/day, 4-5 mountain dew/day.   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  aspirin 81 MG tablet, Take 81 mg by mouth daily., Disp: , Rfl:  .  cholecalciferol (VITAMIN D) 1000 UNITS tablet, Take 1,000 Units by mouth daily. Reported on 01/19/2016, Disp: , Rfl:  .  rosuvastatin (CRESTOR) 5 MG tablet, TAKE 1 TABLET (5 MG TOTAL) BY MOUTH AT BEDTIME., Disp: 90 tablet, Rfl: 0 .  vitamin B-12 (CYANOCOBALAMIN) 500 MCG tablet, Take 1,000 mcg by mouth daily., Disp: , Rfl:  .  ADVAIR DISKUS 250-50 MCG/DOSE AEPB, INHALE 1 PUFF  INTO THE LUNGS 2 (TWO) TIMES DAILY., Disp: 180 each, Rfl: 0 .  albuterol (PROVENTIL HFA;VENTOLIN HFA) 108 (90 Base) MCG/ACT inhaler, Inhale 2 puffs into the lungs every 6 (six) hours as needed for wheezing or shortness of breath. (Patient not taking: Reported on 08/25/2017), Disp: 1 Inhaler, Rfl: 0 .  fluticasone (FLONASE) 50 MCG/ACT nasal spray, Place 2 sprays into both nostrils daily. (Patient not taking: Reported on 08/25/2017), Disp: 16 g, Rfl: 2 .  varenicline (CHANTIX CONTINUING MONTH PAK) 1 MG tablet, Take 1 tablet (1 mg total) by mouth 2 (two) times daily. (Patient not taking: Reported on 12/18/2017), Disp: 120 tablet, Rfl: 0 .  varenicline (CHANTIX STARTING MONTH PAK) 0.5 MG X 11 & 1 MG X 42 tablet, Take one 0.5 mg tablet by mouth once daily for 3 days, then increase to one 0.5 mg tablet twice daily for 4 days, then increase to one 1 mg tablet twice daily. (Patient not taking: Reported on 12/18/2017), Disp: 53 tablet, Rfl: 0  Allergies  Allergen Reactions   . Chantix [Varenicline] Rash     Review of Systems  Constitutional: Negative for chills, fever, malaise/fatigue and weight loss.  HENT: Negative for congestion, ear pain, sinus pain and sore throat.   Eyes: Negative for blurred vision and double vision.  Respiratory: Negative for cough, sputum production and shortness of breath.   Cardiovascular: Negative for chest pain, palpitations and leg swelling.  Gastrointestinal: Negative for abdominal pain, blood in stool, constipation, diarrhea, nausea and vomiting.  Genitourinary: Negative for dysuria, hematuria and urgency.  Musculoskeletal: Positive for back pain (occasional low back pain). Negative for neck pain.  Skin: Negative for itching and rash.  Neurological: Negative for dizziness and headaches.  Psychiatric/Behavioral: Negative for depression. The patient is not nervous/anxious and does not have insomnia.     Objective  Vitals:   12/18/17 0956  BP: 112/72  Pulse: 73  Resp: 18  Temp: 98 F (36.7 C)  TempSrc: Oral  SpO2: 95%  Weight: 195 lb 3.2 oz (88.5 kg)  Height: 5\' 8"  (1.727 m)    Physical Exam  Constitutional: He is oriented to person, place, and time and well-developed, well-nourished, and in no distress.  HENT:  Head: Normocephalic and atraumatic.  Right Ear: Tympanic membrane and ear canal normal.  Left Ear: Tympanic membrane and ear canal normal.  Mouth/Throat: No posterior oropharyngeal erythema.  Cardiovascular: Normal rate, regular rhythm and normal heart sounds.  No murmur heard. Pulmonary/Chest: Effort normal and breath sounds normal. He has no wheezes.  Abdominal: Soft. Bowel sounds are normal. There is no tenderness.  Genitourinary: Prostate normal. Prostate is not enlarged and not tender.  Neurological: He is alert and oriented to person, place, and time.  Psychiatric: Mood, memory, affect and judgment normal.  Nursing note and vitals reviewed.      Assessment & Plan  1. Annual physical  exam Obtain age appropriate laboratory screenings - CBC with Differential/Platelet - COMPLETE METABOLIC PANEL WITH GFR - Lipid panel - TSH - VITAMIN D 25 Hydroxy (Vit-D Deficiency, Fractures)  2. Screening for prostate cancer  - PSA  3. Need for hepatitis C screening test  - Hepatitis C antibody  4. Prediabetes Point-of-care A1c is 5.5%, which is improved and now within normal range - POCT glycosylated hemoglobin (Hb A1C)   Rollins Wrightson Asad [facility] 12/18/2017 10:05 AM

## 2017-12-19 LAB — COMPREHENSIVE METABOLIC PANEL
ALT: 25 IU/L (ref 0–44)
AST: 21 IU/L (ref 0–40)
Albumin/Globulin Ratio: 1.6 (ref 1.2–2.2)
Albumin: 4.2 g/dL (ref 3.6–4.8)
Alkaline Phosphatase: 160 IU/L — ABNORMAL HIGH (ref 39–117)
BUN/Creatinine Ratio: 16 (ref 10–24)
BUN: 17 mg/dL (ref 8–27)
Bilirubin Total: 0.5 mg/dL (ref 0.0–1.2)
CALCIUM: 9.3 mg/dL (ref 8.6–10.2)
CO2: 23 mmol/L (ref 20–29)
CREATININE: 1.06 mg/dL (ref 0.76–1.27)
Chloride: 102 mmol/L (ref 96–106)
GFR calc Af Amer: 85 mL/min/{1.73_m2} (ref >59)
GFR, EST NON AFRICAN AMERICAN: 74 mL/min/{1.73_m2} (ref >59)
GLUCOSE: 94 mg/dL (ref 65–99)
Globulin, Total: 2.7 g/dL (ref 1.5–4.5)
Potassium: 4.8 mmol/L (ref 3.5–5.2)
Sodium: 137 mmol/L (ref 134–144)
Total Protein: 6.9 g/dL (ref 6.0–8.5)

## 2017-12-19 LAB — CBC WITH DIFFERENTIAL/PLATELET
BASOS ABS: 0.1 10*3/uL (ref 0.0–0.2)
Basos: 1 %
EOS (ABSOLUTE): 0.2 10*3/uL (ref 0.0–0.4)
Eos: 2 %
Hematocrit: 45.1 % (ref 37.5–51.0)
Hemoglobin: 15.5 g/dL (ref 13.0–17.7)
IMMATURE GRANULOCYTES: 1 %
Immature Grans (Abs): 0.1 10*3/uL (ref 0.0–0.1)
LYMPHS: 31 %
Lymphocytes Absolute: 2.5 10*3/uL (ref 0.7–3.1)
MCH: 30.9 pg (ref 26.6–33.0)
MCHC: 34.4 g/dL (ref 31.5–35.7)
MCV: 90 fL (ref 79–97)
MONOS ABS: 0.9 10*3/uL (ref 0.1–0.9)
Monocytes: 11 %
Neutrophils Absolute: 4.4 10*3/uL (ref 1.4–7.0)
Neutrophils: 54 %
PLATELETS: 249 10*3/uL (ref 150–379)
RBC: 5.01 x10E6/uL (ref 4.14–5.80)
RDW: 14.3 % (ref 12.3–15.4)
WBC: 8.1 10*3/uL (ref 3.4–10.8)

## 2017-12-19 LAB — LIPID PANEL W/O CHOL/HDL RATIO
Cholesterol, Total: 173 mg/dL (ref 100–199)
HDL: 37 mg/dL — AB (ref >39)
LDL Calculated: 97 mg/dL (ref 0–99)
TRIGLYCERIDES: 197 mg/dL — AB (ref 0–149)
VLDL Cholesterol Cal: 39 mg/dL (ref 5–40)

## 2017-12-19 LAB — TSH: TSH: 2.36 u[IU]/mL (ref 0.450–4.500)

## 2017-12-19 LAB — PSA: Prostate Specific Ag, Serum: 1.1 ng/mL (ref 0.0–4.0)

## 2017-12-19 LAB — HEPATITIS C ANTIBODY: Hep C Virus Ab: <0.1 s/co ratio (ref 0.0–0.9)

## 2017-12-19 LAB — VITAMIN D 25 HYDROXY (VIT D DEFICIENCY, FRACTURES): Vit D, 25-Hydroxy: 22.7 ng/mL — ABNORMAL LOW (ref 30.0–100.0)

## 2017-12-21 ENCOUNTER — Telehealth: Payer: Self-pay | Admitting: Family Medicine

## 2017-12-21 ENCOUNTER — Other Ambulatory Visit: Payer: Self-pay

## 2017-12-21 DIAGNOSIS — E785 Hyperlipidemia, unspecified: Secondary | ICD-10-CM

## 2017-12-21 DIAGNOSIS — E781 Pure hyperglyceridemia: Secondary | ICD-10-CM

## 2017-12-21 MED ORDER — ROSUVASTATIN CALCIUM 10 MG PO TABS: 10.0000 mg | ORAL_TABLET | Freq: Every day | ORAL | 0 refills | Status: DC

## 2017-12-21 MED ORDER — VITAMIN D (ERGOCALCIFEROL) 1.25 MG (50000 UNIT) PO CAPS: 50000.0000 [IU] | ORAL_CAPSULE | ORAL | 0 refills | Status: DC

## 2017-12-21 NOTE — Telephone Encounter (Signed)
Copied from [place] [id number]. Topic: Quick Communication - See Telephone Encounter >> Dec 21, 2017  2:47 PM Bea Graff, NT wrote: CRM for notification. See Telephone encounter for: Pt calling and states he would like the Crestor to be called in for him that he did not have problems with the medication when he took it before. He also needs a refill of the Vitamin D. [facility] [place].  12/21/17.

## 2017-12-21 NOTE — Telephone Encounter (Signed)
Rx has been sent to [facility] off [address]

## 2017-12-27 ENCOUNTER — Telehealth: Payer: Self-pay | Admitting: Family Medicine

## 2017-12-27 DIAGNOSIS — E781 Pure hyperglyceridemia: Secondary | ICD-10-CM

## 2017-12-27 DIAGNOSIS — R748 Abnormal levels of other serum enzymes: Secondary | ICD-10-CM

## 2017-12-27 DIAGNOSIS — E559 Vitamin D deficiency, unspecified: Secondary | ICD-10-CM

## 2017-12-27 NOTE — Telephone Encounter (Signed)
Andrew Mccarthy, can you take a look at Dr. Trena Platt recommendations to [employer] from 12/18/17 labs and put in those orders please? 1. GI referral for elev alk phos 2. Vit D in 13-14 weeks 3. Fasting lipid panel in 6 months Thank you

## 2017-12-28 NOTE — Telephone Encounter (Signed)
Orders entered

## 2018-01-02 ENCOUNTER — Other Ambulatory Visit: Payer: Self-pay | Admitting: Specialist

## 2018-01-02 DIAGNOSIS — R0602 Shortness of breath: Secondary | ICD-10-CM

## 2018-01-02 DIAGNOSIS — J432 Centrilobular emphysema: Secondary | ICD-10-CM

## 2018-01-10 ENCOUNTER — Ambulatory Visit: Payer: 59 | Admitting: Certified Registered Nurse Anesthetist

## 2018-01-10 ENCOUNTER — Encounter: Payer: Self-pay | Admitting: Certified Registered Nurse Anesthetist

## 2018-01-10 ENCOUNTER — Encounter
Admission: RE | Disposition: A | Discharge status: Home / Self Care | Payer: Self-pay | Source: Ambulatory Visit | Attending: Internal Medicine

## 2018-01-10 ENCOUNTER — Ambulatory Visit
Admission: RE | Admit: 2018-01-10 | Discharge: 2018-01-10 | Disposition: A | Discharge status: Home / Self Care | Payer: 59 | Source: Ambulatory Visit | Attending: Internal Medicine | Admitting: Internal Medicine

## 2018-01-10 DIAGNOSIS — Z1211 Encounter for screening for malignant neoplasm of colon: Secondary | ICD-10-CM | POA: Insufficient documentation

## 2018-01-10 DIAGNOSIS — K5730 Diverticulosis of large intestine without perforation or abscess without bleeding: Secondary | ICD-10-CM | POA: Diagnosis not present

## 2018-01-10 DIAGNOSIS — E785 Hyperlipidemia, unspecified: Secondary | ICD-10-CM | POA: Diagnosis not present

## 2018-01-10 DIAGNOSIS — D124 Benign neoplasm of descending colon: Secondary | ICD-10-CM | POA: Insufficient documentation

## 2018-01-10 DIAGNOSIS — Z7951 Long term (current) use of inhaled steroids: Secondary | ICD-10-CM | POA: Insufficient documentation

## 2018-01-10 DIAGNOSIS — E559 Vitamin D deficiency, unspecified: Secondary | ICD-10-CM | POA: Diagnosis not present

## 2018-01-10 DIAGNOSIS — K621 Rectal polyp: Secondary | ICD-10-CM | POA: Insufficient documentation

## 2018-01-10 DIAGNOSIS — Z888 Allergy status to other drugs, medicaments and biological substances status: Secondary | ICD-10-CM | POA: Diagnosis not present

## 2018-01-10 DIAGNOSIS — Z7982 Long term (current) use of aspirin: Secondary | ICD-10-CM | POA: Diagnosis not present

## 2018-01-10 DIAGNOSIS — Z800 Family history of malignant neoplasm of digestive organs: Secondary | ICD-10-CM | POA: Diagnosis not present

## 2018-01-10 DIAGNOSIS — J449 Chronic obstructive pulmonary disease, unspecified: Secondary | ICD-10-CM | POA: Diagnosis not present

## 2018-01-10 DIAGNOSIS — K640 First degree hemorrhoids: Secondary | ICD-10-CM | POA: Diagnosis not present

## 2018-01-10 DIAGNOSIS — Z87442 Personal history of urinary calculi: Secondary | ICD-10-CM | POA: Insufficient documentation

## 2018-01-10 DIAGNOSIS — Z79899 Other long term (current) drug therapy: Secondary | ICD-10-CM | POA: Insufficient documentation

## 2018-01-10 HISTORY — PX: COLONOSCOPY WITH PROPOFOL: SHX5780

## 2018-01-10 HISTORY — DX: Inflammatory liver disease, unspecified: K75.9

## 2018-01-10 SURGERY — COLONOSCOPY WITH PROPOFOL
Anesthesia: General

## 2018-01-10 MED ORDER — LIDOCAINE HCL (PF) 2 % IJ SOLN
INTRAMUSCULAR | Status: AC
  Filled 2018-01-10: qty 10

## 2018-01-10 MED ORDER — SODIUM CHLORIDE 0.9 % IV SOLN
INTRAVENOUS | Status: DC
  Administered 2018-01-10: 1000 mL via INTRAVENOUS

## 2018-01-10 MED ORDER — PROPOFOL 10 MG/ML IV BOLUS
INTRAVENOUS | Status: DC | PRN
  Administered 2018-01-10: 50 mg via INTRAVENOUS

## 2018-01-10 MED ORDER — PROPOFOL 500 MG/50ML IV EMUL
INTRAVENOUS | Status: AC
  Filled 2018-01-10: qty 50

## 2018-01-10 MED ORDER — LIDOCAINE HCL (CARDIAC) 20 MG/ML IV SOLN
INTRAVENOUS | Status: DC | PRN
  Administered 2018-01-10: 50 mg via INTRAVENOUS

## 2018-01-10 MED ORDER — LIDOCAINE HCL (PF) 1 % IJ SOLN
2.0000 mL | Freq: Once | INTRAMUSCULAR | Status: AC
  Administered 2018-01-10: 0.3 mL via INTRADERMAL

## 2018-01-10 MED ORDER — LIDOCAINE HCL (PF) 1 % IJ SOLN
INTRAMUSCULAR | Status: AC
  Administered 2018-01-10: 0.3 mL via INTRADERMAL
  Filled 2018-01-10: qty 2

## 2018-01-10 MED ORDER — PROPOFOL 500 MG/50ML IV EMUL
INTRAVENOUS | Status: DC | PRN
  Administered 2018-01-10: 175 ug/kg/min via INTRAVENOUS

## 2018-01-10 NOTE — Op Note (Signed)
[facility] Patient Name: Andrew Mccarthy Procedure Date: 01/10/2018 12:01 PM MRN: [id number] Account #: [id number] Date of Birth: Aug 18, 1951 Admit Type: Outpatient Age: 67 Room: [facility] ENDO ROOM 4 Gender: Male Note Status: Finalized Procedure:            Colonoscopy Indications:          Screening for colorectal malignant neoplasm Providers:            [name]. Chelesa Weingartner MD, MD Medicines:            Propofol per Anesthesia Complications:        No immediate complications. Procedure:            Pre-Anesthesia Assessment:                       - The risks and benefits of the procedure and the                        sedation options and risks were discussed with the                        patient. All questions were answered and informed                        consent was obtained.                       - Patient identification and proposed procedure were                        verified prior to the procedure by the nurse. The                        procedure was verified in the procedure room.                       - ASA Grade Assessment: II - A patient with mild                        systemic disease.                       - After reviewing the risks and benefits, the patient                        was deemed in satisfactory condition to undergo the                        procedure.                       After obtaining informed consent, the colonoscope was                        passed under direct vision. Throughout the procedure,                        the patient's blood pressure, pulse, and oxygen                        saturations were monitored continuously. The  Colonoscope was introduced through the anus and                        advanced to the the cecum, identified by appendiceal                        orifice and ileocecal valve. The colonoscopy was                        performed without difficulty. The patient  tolerated the                        procedure well. The quality of the bowel preparation                        was good. The ileocecal valve, appendiceal orifice, and                        rectum were photographed. Findings:      The perianal and digital rectal examinations were normal. Pertinent       negatives include normal sphincter tone and no palpable rectal lesions.      A 9 mm polyp was found in the descending colon. The polyp was sessile.       The polyp was removed with a hot snare. Resection and retrieval were       complete.      A 4 mm polyp was found in the rectum. The polyp was sessile. The polyp       was removed with a jumbo cold forceps. Resection and retrieval were       complete.      Multiple small and large-mouthed diverticula were found in the sigmoid       colon and descending colon. There was no evidence of diverticular       bleeding.      Non-bleeding internal hemorrhoids were found during retroflexion. The       hemorrhoids were Grade I (internal hemorrhoids that do not prolapse).      The exam was otherwise without abnormality. Impression:           - One 9 mm polyp in the descending colon, removed with                        a hot snare. Resected and retrieved.                       - One 4 mm polyp in the rectum, removed with a jumbo                        cold forceps. Resected and retrieved.                       - Moderate diverticulosis in the sigmoid colon and in                        the descending colon. There was no evidence of                        diverticular bleeding.                       -  Non-bleeding internal hemorrhoids.                       - The examination was otherwise normal. Recommendation:       - Patient has a contact number available for                        emergencies. The signs and symptoms of potential                        delayed complications were discussed with the patient.                        Return to normal  activities tomorrow. Written discharge                        instructions were provided to the patient.                       - Resume previous diet.                       - Continue present medications.                       - Repeat colonoscopy is recommended for surveillance.                        The colonoscopy date will be determined after pathology                        results from today's exam become available for review.                       - The findings and recommendations were discussed with                        the patient and their family. Procedure Code(s):    --- Professional ---                       [phone], Colonoscopy, flexible; with removal of tumor(s),                        polyp(s), or other lesion(s) by snare technique                       [id number], 42, Colonoscopy, flexible; with biopsy, single                        or multiple Diagnosis Code(s):    --- Professional ---                       K57.30, Diverticulosis of large intestine without                        perforation or abscess without bleeding                       K64.0, First degree hemorrhoids  K62.1, Rectal polyp                       D12.4, Benign neoplasm of descending colon                       Z12.11, Encounter for screening for malignant neoplasm                        of colon CPT copyright 2016 American Medical Association. All rights reserved. The codes documented in this report are preliminary and upon coder review may  be revised to meet current compliance requirements. Efrain Sella MD, MD 01/10/2018 12:33:31 PM This report has been signed electronically. Number of Addenda: 0 Note Initiated On: 01/10/2018 12:01 PM Scope Withdrawal Time: 0 hours 7 minutes 19 seconds  Total Procedure Duration: 0 hours 13 minutes 42 seconds       [facility]

## 2018-01-10 NOTE — Anesthesia Procedure Notes (Signed)
Date/Time: 01/10/2018 12:08 PM Performed by: Johnna Acosta, CRNA Pre-anesthesia Checklist: Patient identified, Emergency Drugs available, Suction available and Patient being monitored Patient Re-evaluated:Patient Re-evaluated prior to induction Oxygen Delivery Method: Nasal cannula Preoxygenation: Pre-oxygenation with 100% oxygen

## 2018-01-10 NOTE — Interval H&P Note (Signed)
History and Physical Interval Note:  01/10/2018 12:02 PM  Andrew Mccarthy  has presented today for surgery, with the diagnosis of [facility]  The various methods of treatment have been discussed with the patient and family. After consideration of risks, benefits and other options for treatment, the patient has consented to  Procedure(s): COLONOSCOPY WITH PROPOFOL (N/A) as a surgical intervention .  The patient's history has been reviewed, patient examined, no change in status, stable for surgery.  I have reviewed the patient's chart and labs.  Questions were answered to the patient's satisfaction.     [place], [place]

## 2018-01-10 NOTE — H&P (Signed)
Outpatient short stay form Pre-procedure 01/10/2018 12:00 PM Andrew Mccarthy K. [name], M.D.  Primary Physician: [name], M.D.  Reason for visit:  Colon cancer screening, Family hx of colon cancer.  History of present illness:  Patient with family hx of colon cancer presents for colonoscopy. Patient denies change in bowel habits, rectal bleeding or weight loss.     Current Facility-Administered Medications:  .  0.9 %  sodium chloride infusion, , Intravenous, Continuous, [place], [name], MD, Last Rate: 20 mL/hr at 01/10/18 1143, 1,000 mL at 01/10/18 1143  Medications Prior to Admission  Medication Sig Dispense Refill Last Dose  . ADVAIR DISKUS 250-50 MCG/DOSE AEPB INHALE 1 PUFF INTO THE LUNGS 2 (TWO) TIMES DAILY. 180 each 0 Taking  . albuterol (PROVENTIL HFA;VENTOLIN HFA) 108 (90 Base) MCG/ACT inhaler Inhale 2 puffs into the lungs every 6 (six) hours as needed for wheezing or shortness of breath. (Patient not taking: Reported on 08/25/2017) 1 Inhaler 0 Not Taking  . aspirin 81 MG tablet Take 81 mg by mouth daily.   01/07/2018  . cholecalciferol (VITAMIN D) 1000 UNITS tablet Take 1,000 Units by mouth daily. Reported on 01/19/2016   Taking  . fluticasone (FLONASE) 50 MCG/ACT nasal spray Place 2 sprays into both nostrils daily. (Patient not taking: Reported on 08/25/2017) 16 g 2 Not Taking  . rosuvastatin (CRESTOR) 10 MG tablet Take 1 tablet (10 mg total) by mouth at bedtime. 90 tablet 0   . varenicline (CHANTIX CONTINUING MONTH PAK) 1 MG tablet Take 1 tablet (1 mg total) by mouth 2 (two) times daily. (Patient not taking: Reported on 12/18/2017) 120 tablet 0 Not Taking  . varenicline (CHANTIX STARTING MONTH PAK) 0.5 MG X 11 & 1 MG X 42 tablet Take one 0.5 mg tablet by mouth once daily for 3 days, then increase to one 0.5 mg tablet twice daily for 4 days, then increase to one 1 mg tablet twice daily. (Patient not taking: Reported on 12/18/2017) 53 tablet 0 Not Taking  . vitamin B-12 (CYANOCOBALAMIN) 500 MCG  tablet Take 1,000 mcg by mouth daily.   Taking  . Vitamin D, Ergocalciferol, (DRISDOL) 50000 units CAPS capsule Take 1 capsule (50,000 Units total) by mouth every 7 (seven) days. For 12 weeks. 12 capsule 0      Allergies  Allergen Reactions  . Chantix [Varenicline] Rash     Past Medical History:  Diagnosis Date  . COPD (chronic obstructive pulmonary disease) (Andrew Mccarthy)   . ED (erectile dysfunction)   . 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 History of gastritis    not currently an issue  . 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 History of kidney stones last 2013  . Hyperlipidemia   . Hypogonadism male   . Smoker   . Vitamin D deficiency     Review of systems:   Negative.   Physical Exam  Gen: Alert, oriented. Appears stated age.  HEENT: [place]/AT. PERRLA. Lungs: CTA, no wheezes. CV: RR nl S1, S2. Abd: soft, benign, no masses. BS+ Ext: No edema. Pulses 2+    Planned procedures: Colonoscopy. The patient understands the nature of the planned procedure, indications, risks, alternatives and potential complications including but not limited to bleeding, infection, perforation, damage to internal organs and possible oversedation/side effects from anesthesia. The patient agrees and gives consent to proceed.  Please refer to procedure notes for findings, recommendations and patient disposition/instructions.    Amariya Liskey K. [name], M.D. Gastroenterology 01/10/2018  12:00 PM

## 2018-01-10 NOTE — Anesthesia Post-op Follow-up Note (Signed)
Anesthesia QCDR form completed.        

## 2018-01-10 NOTE — Anesthesia Preprocedure Evaluation (Signed)
Anesthesia Evaluation  Patient identified by MRN, date of birth, ID band Patient awake    Reviewed: Allergy & Precautions, H&P , NPO status , Patient's Chart, lab work & pertinent test results  History of Anesthesia Complications Negative for: history of anesthetic complications  Airway Mallampati: III  TM Distance: <3 FB Neck ROM: limited    Dental  (+) Chipped, Poor Dentition, Missing   Pulmonary neg shortness of breath, pneumonia, COPD, former smoker,           Cardiovascular Exercise Tolerance: Good (-) angina+ Peripheral Vascular Disease  (-) Past MI and (-) DOE      Neuro/Psych negative neurological ROS  negative psych ROS   GI/Hepatic negative GI ROS, (+) Hepatitis -  Endo/Other  negative endocrine ROS  Renal/GU negative Renal ROS  negative genitourinary   Musculoskeletal   Abdominal   Peds  Hematology negative hematology ROS (+)   Anesthesia Other Findings Past Medical History: No date: COPD (chronic obstructive pulmonary disease) (HCC) No date: ED (erectile dysfunction) 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 No date: History of gastritis     Comment:  not currently an issue 1984: History of hepatitis     Comment:  viral/CMV last 2013: History of kidney stones No date: Hyperlipidemia No date: Hypogonadism male No date: Smoker No date: Vitamin D deficiency  Past Surgical History: 2010: CARDIOVASCULAR STRESS TEST     Comment:  myoview, WNL 09/2007: COLONOSCOPY     Comment:  WNL, rpt 10 yrs [name]) [facility] 09/2007: ESOPHAGOGASTRODUODENOSCOPY     Comment:  WNL,  1993: Kidney Stone Retraction 01/2012: LITHOTRIPSY 2003: PILONIDAL CYST EXCISION  BMI    Body Mass Index:  28.89 kg/m      Reproductive/Obstetrics negative OB ROS                             Anesthesia Physical Anesthesia Plan  ASA: III  Anesthesia Plan: General   Post-op Pain Management:     Induction: Intravenous  PONV Risk Score and Plan: Propofol infusion  Airway Management Planned: Natural Airway and Nasal Cannula  Additional Equipment:   Intra-op Plan:   Post-operative Plan:   Informed Consent: I have reviewed the patients History and Physical, chart, labs and discussed the procedure including the risks, benefits and alternatives for the proposed anesthesia with the patient or authorized representative who has indicated his/her understanding and acceptance.   Dental Advisory Given  Plan Discussed with: Anesthesiologist, CRNA and Surgeon  Anesthesia Plan Comments: (Patient consented for risks of anesthesia including but not limited to:  - adverse reactions to medications - risk of intubation if required - damage to teeth, lips or other oral mucosa - sore throat or hoarseness - Damage to heart, brain, lungs or loss of life  Patient voiced understanding.)        Anesthesia Quick Evaluation

## 2018-01-10 NOTE — Transfer of Care (Signed)
Immediate Anesthesia Transfer of Care Note  Patient: Andrew Mccarthy  Procedure(s) Performed: COLONOSCOPY WITH PROPOFOL (N/A )  Patient Location: PACU  Anesthesia Type:General  Level of Consciousness: sedated  Airway & Oxygen Therapy: Patient Spontanous Breathing and Patient connected to nasal cannula oxygen  Post-op Assessment: Report given to RN and Post -op Vital signs reviewed and stable  Post vital signs: Reviewed and stable  Last Vitals:  Vitals:   01/10/18 1119 01/10/18 1229  BP: 131/78 97/63  Pulse: (!) 50 (!) 49  Resp: 17 16  Temp: (!) 35.9 C (!) 36.1 C  SpO2: 98% 97%    Last Pain:  Vitals:   01/10/18 1229  TempSrc: Tympanic  PainSc: Asleep         Complications: No apparent anesthesia complications

## 2018-01-11 ENCOUNTER — Encounter: Payer: Self-pay | Admitting: Internal Medicine

## 2018-01-11 LAB — SURGICAL PATHOLOGY

## 2018-01-11 NOTE — Anesthesia Postprocedure Evaluation (Signed)
Anesthesia Post Note  Patient: Andrew Mccarthy  Procedure(s) Performed: COLONOSCOPY WITH PROPOFOL (N/A )  Patient location during evaluation: Endoscopy Anesthesia Type: General Level of consciousness: awake and alert Pain management: pain level controlled Vital Signs Assessment: post-procedure vital signs reviewed and stable Respiratory status: spontaneous breathing, nonlabored ventilation, respiratory function stable and patient connected to nasal cannula oxygen Cardiovascular status: blood pressure returned to baseline and stable Postop Assessment: no apparent nausea or vomiting Anesthetic complications: no     Last Vitals:  Vitals:   01/10/18 1249 01/10/18 1259  BP: 104/78 124/78  Pulse: (!) 44 (!) 43  Resp: 11 (!) 21  Temp:    SpO2: 99% 96%    Last Pain:  Vitals:   01/10/18 1239  TempSrc:   PainSc: Asleep                 Precious Haws Piscitello

## 2018-03-08 ENCOUNTER — Other Ambulatory Visit: Payer: Self-pay | Admitting: Nurse Practitioner

## 2018-03-16 ENCOUNTER — Other Ambulatory Visit: Payer: Self-pay | Admitting: Nurse Practitioner

## 2018-03-19 ENCOUNTER — Ambulatory Visit: Payer: 59 | Admitting: Nurse Practitioner

## 2018-03-19 ENCOUNTER — Ambulatory Visit: Payer: 59 | Admitting: Family Medicine

## 2018-03-19 ENCOUNTER — Other Ambulatory Visit: Payer: Self-pay

## 2018-03-19 ENCOUNTER — Encounter: Payer: Self-pay | Admitting: Nurse Practitioner

## 2018-03-19 VITALS — BP 122/84 | HR 81 | Temp 98.9°F | Resp 18 | Ht 68.0 in | Wt 201.3 lb

## 2018-03-19 DIAGNOSIS — E559 Vitamin D deficiency, unspecified: Secondary | ICD-10-CM

## 2018-03-19 DIAGNOSIS — G4700 Insomnia, unspecified: Secondary | ICD-10-CM | POA: Diagnosis not present

## 2018-03-19 DIAGNOSIS — Z87891 Personal history of nicotine dependence: Secondary | ICD-10-CM | POA: Diagnosis not present

## 2018-03-19 DIAGNOSIS — J449 Chronic obstructive pulmonary disease, unspecified: Secondary | ICD-10-CM

## 2018-03-19 DIAGNOSIS — E785 Hyperlipidemia, unspecified: Secondary | ICD-10-CM | POA: Diagnosis not present

## 2018-03-19 DIAGNOSIS — E781 Pure hyperglyceridemia: Secondary | ICD-10-CM

## 2018-03-19 DIAGNOSIS — I700 Atherosclerosis of aorta: Secondary | ICD-10-CM

## 2018-03-19 MED ORDER — ROSUVASTATIN CALCIUM 10 MG PO TABS: 10.0000 mg | ORAL_TABLET | Freq: Every day | ORAL | 0 refills | Status: DC

## 2018-03-19 NOTE — Progress Notes (Signed)
Name: Andrew Mccarthy   MRN: [id number]    DOB: 06-11-1951   Date:03/19/2018       Progress Note  Subjective  Chief Complaint  Chief Complaint  Patient presents with  . Medication Refill    HPI  Hyperlipidemia Patient rx crestor- states started out taking it really well but just started to forget about it and hasnt taken it in about 3 weeks.  Diet: 3-4 vegetable a week. Eats out rarely. Fried foods- loves them, the only way he likes his food.  Denies myalgias  COPD Patien rx advair, albuterol- states doesn't take advair anymore he "weened himself off of it". States he still gets shob with exertion. Follows up with Dr. Vella Kohler- pulmonology   Vitamin D deficiency  Patient took 50,000 Units for 12 weeks- sts completed a few weeks ago. States feels fatigued, takes naps during the day- 30 min- 1 hour once a day.   Insomnia Patient doesn't have trouble going to sleep but has trouble staying asleep- states never gets into a deep sleep. Just rolls around a lot. Patient states when he was working had no trouble but since retired cant stay asleep. Watches tv in bed, drinks mountain dew all day long. No exercise   Patient quit smoking- 1/7 this year- longest he has gone without it.   Patient Active Problem List   Diagnosis Date Noted  . History of smoking 30 or more pack years 03/19/2018  . Aortic atherosclerosis ([place]) 06/28/2016  . Hyperglycemia 06/28/2016  . Obesity 03/10/2016  . Hypertriglyceridemia 01/19/2016  . Sinus bradycardia on ECG 12/31/2015  . 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 COPD (chronic obstructive pulmonary disease) ([place]) 12/09/2015  . Vitamin D deficiency   . History of kidney stones   . Hypogonadism male   . ED (erectile dysfunction)     Past Medical History:  Diagnosis Date  . COPD (chronic obstructive pulmonary disease) ([place])   . ED (erectile dysfunction)   . 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 History of gastritis    not currently an issue  . History of hepatitis  1984   viral/CMV  . History of kidney stones last 2013  . Hyperlipidemia   . Hypogonadism male   . Smoker   . Vitamin D deficiency     Past Surgical History:  Procedure Laterality Date  . CARDIOVASCULAR STRESS TEST  2010   myoview, WNL  . COLONOSCOPY  09/2007   WNL, rpt 10 yrs [name]) [place]  . COLONOSCOPY WITH PROPOFOL N/A 01/10/2018   Procedure: COLONOSCOPY WITH PROPOFOL;  Surgeon: Toledo, [name], MD;  Location: [facility] ENDOSCOPY;  Service: Gastroenterology;  Laterality: N/A;  . ESOPHAGOGASTRODUODENOSCOPY  09/2007   WNL,   . Kidney Stone Retraction  1993  . LITHOTRIPSY  01/2012  . PILONIDAL CYST EXCISION  2003    Social History   Tobacco Use  . Smoking status: Former Smoker    Packs/day: 2.00    Years: 35.00    Pack years: 70.00    Types: Cigarettes    Start date: 07/16/2017  . Smokeless tobacco: Never Used  Substance Use Topics  . Alcohol use: No     Current Outpatient Medications:  .  aspirin 81 MG tablet, Take 81 mg by mouth daily., Disp: , Rfl:  .  cholecalciferol (VITAMIN D) 1000 UNITS tablet, Take 1,000 Units by mouth daily. Reported on 01/19/2016, Disp: , Rfl:  .  rosuvastatin (CRESTOR) 10 MG tablet, Take 1 tablet (10 mg total) by  mouth at bedtime., Disp: 90 tablet, Rfl: 0 .  vitamin B-12 (CYANOCOBALAMIN) 500 MCG tablet, Take 1,000 mcg by mouth daily., Disp: , Rfl:  .  Vitamin D, Ergocalciferol, (DRISDOL) 50000 units CAPS capsule, Take 1 capsule (50,000 Units total) by mouth every 7 (seven) days. For 12 weeks., Disp: 12 capsule, Rfl: 0 .  ADVAIR DISKUS 250-50 MCG/DOSE AEPB, INHALE 1 PUFF INTO THE LUNGS 2 (TWO) TIMES DAILY., Disp: 180 each, Rfl: 0 .  albuterol (PROVENTIL HFA;VENTOLIN HFA) 108 (90 Base) MCG/ACT inhaler, Inhale 2 puffs into the lungs every 6 (six) hours as needed for wheezing or shortness of breath. (Patient not taking: Reported on 08/25/2017), Disp: 1 Inhaler, Rfl: 0  Allergies  Allergen Reactions  . Chantix [Varenicline] Rash     ROS  Constitutional: Negative for fever or unexpected weight change.  Respiratory: Negative for cough and endorses exertional shortness of breath.   Cardiovascular: Negative for chest pain or palpitations.  Gastrointestinal: Negative for abdominal pain, no bowel changes.  Musculoskeletal: Negative for gait problem or joint swelling.  Skin: Negative for rash.  Neurological: Negative for dizziness Endorses a headache last week resolved with ibuprofen.  No other specific complaints in a complete review of systems (except as listed in HPI above).  Objective  Vitals:   03/19/18 0935  BP: 122/84  Pulse: 81  Resp: 18  Temp: 98.9 F (37.2 C)  TempSrc: Oral  SpO2: 97%  Weight: 201 lb 4.8 oz (91.3 kg)  Height: 5\' 8"  (1.727 m)    Body mass index is 30.61 kg/m.  Nursing Note and Vital Signs reviewed.  Physical Exam  Constitutional: Patient appears well-developed and well-nourished. Obese. No distress.  Cardiovascular: Normal rate, regular rhythm, S1/S2 present.  No murmur or rub heard.  Pulmonary/Chest: Effort normal and breath sounds clear. No respiratory distress or retractions. Psychiatric: Patient has a normal mood and affect. behavior is normal. Judgment and thought content normal.  No results found for this or any previous visit (from the past 72 hour(s)).  Assessment & Plan  1. Hypertriglyceridemia Given physical order to get done at lab his insurance accepts - rosuvastatin (CRESTOR) 10 MG tablet; Take 1 tablet (10 mg total) by mouth at bedtime.  Dispense: 90 tablet; Refill: 0  2. Dyslipidemia - Given physical order for lipids to get done at lab his insurance accepts - rosuvastatin (CRESTOR) 10 MG tablet; Take 1 tablet (10 mg total) by mouth at bedtime.  Dispense: 90 tablet; Refill: 0  3. History of smoking 30 or more pack years - quit- great job   4. Chronic obstructive pulmonary disease, unspecified COPD type ([place]) - continue f/up with pulmonology, discussed  restarting advair pt declines presently.   5. Aortic atherosclerosis (HCC) - cont ASA, lipid control   6. Vitamin D deficiency Given physical order to get done at lab his insurance accepts Cont. 1,000-2,000 units daily   7. Insomnia  discussed cutting out mountain dew after 4pm, pt unwilling to quit, discussed sleep hygiene and exercise 3-4 hours before planning on sleeping.   Follow-up in 4 months with new PCP  -Red flags and when to present for emergency care or RTC including fever >101.46F, chest pain, shortness of breath, new/worsening/un-resolving symptoms,  reviewed with patient at time of visit. Follow up and care instructions discussed and provided in AVS.

## 2018-03-19 NOTE — Patient Instructions (Addendum)
-   Please take your crestor every night if you can remember   You can follow good "sleep hygiene." That means that you: ?Sleep only long enough to feel rested and then get out of bed ?Go to bed and get up at the same time every day ?Do not try to force yourself to sleep. If you can't sleep, get out of bed and try again later. ?Have coffee, tea, and other foods that have caffeine only in the morning ?Avoid alcohol in the late afternoon, evening, and bedtime ?Avoid smoking, especially in the evening ?Keep your bedroom dark, cool, quiet, and free of reminders of work or other things that cause you stress ?Solve problems you have before you go to bed ?Exercise several days a week, but not right before bed ?Avoid looking at phones or reading devices ("e-books") that give off light before bed. This can make it harder to fall asleep. Other things that can improve sleep include: ?Relaxation therapy, in which you focus on relaxing all the muscles in your body 1 by 1 ?Working with a Engineer, materials to deal with the problems that might be causing poor sleep

## 2018-06-14 ENCOUNTER — Ambulatory Visit: Payer: 59

## 2018-06-14 ENCOUNTER — Other Ambulatory Visit: Payer: Self-pay | Admitting: Nurse Practitioner

## 2018-06-14 DIAGNOSIS — E781 Pure hyperglyceridemia: Secondary | ICD-10-CM

## 2018-06-14 DIAGNOSIS — E785 Hyperlipidemia, unspecified: Secondary | ICD-10-CM

## 2018-06-18 ENCOUNTER — Telehealth: Payer: Self-pay | Admitting: *Deleted

## 2018-06-18 ENCOUNTER — Ambulatory Visit
Admission: RE | Admit: 2018-06-18 | Discharge: 2018-06-18 | Disposition: A | Discharge status: Home / Self Care | Payer: 59 | Source: Ambulatory Visit | Attending: Specialist | Admitting: Specialist

## 2018-06-18 DIAGNOSIS — Z87891 Personal history of nicotine dependence: Secondary | ICD-10-CM

## 2018-06-18 DIAGNOSIS — R0602 Shortness of breath: Secondary | ICD-10-CM

## 2018-06-18 DIAGNOSIS — J432 Centrilobular emphysema: Secondary | ICD-10-CM

## 2018-06-18 DIAGNOSIS — Z122 Encounter for screening for malignant neoplasm of respiratory organs: Secondary | ICD-10-CM

## 2018-06-18 NOTE — Telephone Encounter (Signed)
Patient has been notified that annual lung cancer screening low dose CT scan is due currently or will be in near future. Confirmed that patient is within the age range of 55-77, and asymptomatic, (no signs or symptoms of lung cancer). Patient denies illness that would prevent curative treatment for lung cancer if found. Verified smoking history, (former, quit 2016, 70 pack year). The shared decision making visit was done 06/21/16. Patient is agreeable for CT scan being scheduled.

## 2018-07-06 ENCOUNTER — Ambulatory Visit
Admission: RE | Admit: 2018-07-06 | Discharge: 2018-07-06 | Disposition: A | Discharge status: Home / Self Care | Payer: 59 | Source: Ambulatory Visit | Attending: Nurse Practitioner | Admitting: Nurse Practitioner

## 2018-07-06 DIAGNOSIS — Z122 Encounter for screening for malignant neoplasm of respiratory organs: Secondary | ICD-10-CM

## 2018-07-06 DIAGNOSIS — I700 Atherosclerosis of aorta: Secondary | ICD-10-CM | POA: Diagnosis not present

## 2018-07-06 DIAGNOSIS — Z87891 Personal history of nicotine dependence: Secondary | ICD-10-CM

## 2018-07-06 DIAGNOSIS — J432 Centrilobular emphysema: Secondary | ICD-10-CM | POA: Insufficient documentation

## 2018-07-06 DIAGNOSIS — I2510 Atherosclerotic heart disease of native coronary artery without angina pectoris: Secondary | ICD-10-CM | POA: Insufficient documentation

## 2018-07-10 ENCOUNTER — Encounter: Payer: Self-pay | Admitting: *Deleted

## 2018-07-15 ENCOUNTER — Telehealth: Payer: Self-pay | Admitting: Family Medicine

## 2018-07-15 ENCOUNTER — Encounter: Payer: Self-pay | Admitting: Family Medicine

## 2018-07-15 DIAGNOSIS — E559 Vitamin D deficiency, unspecified: Secondary | ICD-10-CM

## 2018-07-15 DIAGNOSIS — Z5181 Encounter for therapeutic drug level monitoring: Secondary | ICD-10-CM

## 2018-07-15 DIAGNOSIS — I700 Atherosclerosis of aorta: Secondary | ICD-10-CM

## 2018-07-15 DIAGNOSIS — R748 Abnormal levels of other serum enzymes: Secondary | ICD-10-CM | POA: Insufficient documentation

## 2018-07-15 DIAGNOSIS — I2584 Coronary atherosclerosis due to calcified coronary lesion: Secondary | ICD-10-CM

## 2018-07-15 DIAGNOSIS — E785 Hyperlipidemia, unspecified: Secondary | ICD-10-CM

## 2018-07-15 DIAGNOSIS — E781 Pure hyperglyceridemia: Secondary | ICD-10-CM

## 2018-07-15 DIAGNOSIS — I2510 Atherosclerotic heart disease of native coronary artery without angina pectoris: Secondary | ICD-10-CM | POA: Insufficient documentation

## 2018-07-15 NOTE — Telephone Encounter (Signed)
Please let pt know that his chest CT showed emphysema, coronary artery calficiation, and aortic atherosclerosis We'll want him to contact Dr. Ubaldo Glassing, his cardiologist, to see about the need for any additional testing We'll want him to continue daily aspirin We'll ask him to have the lipid panel done prior to his appointment Please also add vitamin D level (per Dr. Trena Platt recommendation) and a CMP Review of Dr. Trena Platt notes also shows that he was supposed to see Dr. [name] for an elevated alk phos; please make sure he went or re-order referral and add on GGT and alk phos isoenzymes Thank you

## 2018-07-16 ENCOUNTER — Other Ambulatory Visit: Payer: Self-pay

## 2018-07-16 DIAGNOSIS — R748 Abnormal levels of other serum enzymes: Secondary | ICD-10-CM

## 2018-07-16 DIAGNOSIS — E781 Pure hyperglyceridemia: Secondary | ICD-10-CM

## 2018-07-16 DIAGNOSIS — Z5181 Encounter for therapeutic drug level monitoring: Secondary | ICD-10-CM

## 2018-07-16 DIAGNOSIS — E559 Vitamin D deficiency, unspecified: Secondary | ICD-10-CM

## 2018-07-16 DIAGNOSIS — E785 Hyperlipidemia, unspecified: Secondary | ICD-10-CM

## 2018-07-16 NOTE — Telephone Encounter (Signed)
Pt notified, labs and referral enetered.

## 2018-07-18 ENCOUNTER — Telehealth: Payer: Self-pay | Admitting: Family Medicine

## 2018-07-18 NOTE — Telephone Encounter (Signed)
Copied from [place] [phone]. Topic: Quick Communication - See Telephone Encounter >> Jul 18, 2018 10:46 AM Nils Flack wrote: CRM for notification. See Telephone encounter for: 07/18/18.wife spoke to [facility], and they refused referral. Wife stated she will call Dr Sanda Klein to talk about this referral

## 2018-07-18 NOTE — Telephone Encounter (Signed)
Pt called to advise he has already seen GI, and had a colonoscopy this year.  No need for GI referral. Also, wanted to see Dr Sanda Klein sooner than 10/08, he cannot come that day anyway. Pt scheduled 9/12 at 10. Pt also advised he has just gone on medicare this month

## 2018-07-19 ENCOUNTER — Telehealth: Payer: Self-pay | Admitting: Family Medicine

## 2018-07-19 ENCOUNTER — Ambulatory Visit (INDEPENDENT_AMBULATORY_CARE_PROVIDER_SITE_OTHER): Payer: Medicare Other | Admitting: Family Medicine

## 2018-07-19 ENCOUNTER — Encounter: Payer: Self-pay | Admitting: Family Medicine

## 2018-07-19 VITALS — BP 126/82 | HR 63 | Temp 98.1°F | Resp 12 | Ht 68.0 in | Wt 197.3 lb

## 2018-07-19 DIAGNOSIS — I2510 Atherosclerotic heart disease of native coronary artery without angina pectoris: Secondary | ICD-10-CM | POA: Diagnosis not present

## 2018-07-19 DIAGNOSIS — R001 Bradycardia, unspecified: Secondary | ICD-10-CM

## 2018-07-19 DIAGNOSIS — I700 Atherosclerosis of aorta: Secondary | ICD-10-CM

## 2018-07-19 DIAGNOSIS — Z87891 Personal history of nicotine dependence: Secondary | ICD-10-CM

## 2018-07-19 DIAGNOSIS — J449 Chronic obstructive pulmonary disease, unspecified: Secondary | ICD-10-CM

## 2018-07-19 DIAGNOSIS — E669 Obesity, unspecified: Secondary | ICD-10-CM

## 2018-07-19 DIAGNOSIS — Z0000 Encounter for general adult medical examination without abnormal findings: Secondary | ICD-10-CM | POA: Diagnosis not present

## 2018-07-19 DIAGNOSIS — R748 Abnormal levels of other serum enzymes: Secondary | ICD-10-CM

## 2018-07-19 NOTE — Assessment & Plan Note (Signed)
Patient to see Dr. Ubaldo Glassing; continue baby aspirin

## 2018-07-19 NOTE — Telephone Encounter (Signed)
Copied from [place] [phone]. Topic: Quick Communication - See Telephone Encounter >> Jul 19, 2018  4:05 PM Nils Flack wrote: CRM for notification. See Telephone encounter for: 07/19/18. Pt is asking why he needs to have ultrasound, says he was just in for testing recently  Cb is [phone]

## 2018-07-19 NOTE — Assessment & Plan Note (Signed)
Try to lose just modest weight

## 2018-07-19 NOTE — Assessment & Plan Note (Signed)
Former smoker; explained with aortic model; continue statin

## 2018-07-19 NOTE — Assessment & Plan Note (Signed)
USPSTF grade A and B recommendations reviewed with patient; age-appropriate recommendations, preventive care, screening tests, etc discussed and encouraged; healthy living encouraged; see AVS for patient education given to patient  

## 2018-07-19 NOTE — Progress Notes (Signed)
Patient: Andrew Mccarthy, Male    DOB: 1951-01-23, 67 y.o.   MRN: [id number]  Visit Date: 07/30/2018  Today's Provider: Enid Derry, MD   Chief Complaint  Patient presents with  . welcome to medicare    Subjective:   Andrew Mccarthy is a 67 y.o. male who presents today for his Subsequent Annual Wellness Visit.  Patient is here for his welcome to medicare visit; he is new to me today, as his previous provider left the practice  Fatty liver; elev alk phos noted  High cholesterol; he just stated back to taking statin  Coronary artery disease noted on CT scan; referred to Dr. Ubaldo Glassing by pulm  Emphysema, seen by Dr. Raul Del; he asked him about dizziness, when he rolls over just a sec; numbness in the left hand when curled up going to sleep  Prediabetes; glucose 103, fasting   USPSTF grade A and B recommendations Depression:  Depression screen [facility] 2/9 07/19/2018 11/15/2017 01/03/2017 08/02/2016 01/19/2016  Decreased Interest 0 0 0 0 0  Down, Depressed, Hopeless 0 0 0 0 0  PHQ - 2 Score 0 0 0 0 0  Altered sleeping 2 - - - -  Tired, decreased energy 1 - - - -  Change in appetite 1 - - - -  Feeling bad or failure about yourself  0 - - - -  Trouble concentrating 0 - - - -  Moving slowly or fidgety/restless 0 - - - -  Suicidal thoughts 0 - - - -  PHQ-9 Score 4 - - - -  Difficult doing work/chores Not difficult at all - - - -  worked 12 hour shifts and could go 7 days a week; now he's retired and has gotten lazy, no mojo; once he gets started he can work  Hypertension: BP Readings from Last 3 Encounters:  07/19/18 126/82  03/19/18 122/84  01/10/18 124/78   Obesity: Wt Readings from Last 3 Encounters:  07/19/18 197 lb 4.8 oz (89.5 kg)  07/06/18 190 lb (86.2 kg)  03/19/18 201 lb 4.8 oz (91.3 kg)   BMI Readings from Last 3 Encounters:  07/19/18 30.00 kg/m  07/06/18 28.89 kg/m  03/19/18 30.61 kg/m    Immunizations: PCV-13 at age 46; he does not take flu shots Skin cancer: saw  dermatologist on [place] Lung cancer:  Getting screening Prostate cancer: PSA 1.1 in Feb Lab Results  Component Value Date   PSA 0.64 10/15/2012   PSA 0.73 10/21/2011   Colorectal cancer: just done; son had colon cancer and pt had polyps, was told he needed it in three AAA: order [place] Aspirin: taking Diet: try to increase fruits and veggies, try to decrease fatty meats Exercise: will try tro build up gradually Alcohol:    [facility] Visit from 07/19/2018 in [facility]  AUDIT-C Score  0     Tobacco use: hx HIV, hep B, hep C: hep C neg STD testing and prevention (chl/gon/syphilis): n/a Glucose:  Glucose  Date Value Ref Range Status  12/18/2017 94 65 - 99 mg/dL Final  01/03/2017 110 (H) 65 - 99 mg/dL Final  12/31/2015 105 (H) 65 - 99 mg/dL Final  01/22/2012 89 65 - 99 mg/dL Final  01/21/2012 99 65 - 99 mg/dL Final  01/20/2012 123 (H) 65 - 99 mg/dL Final   Glucose, Bld  Date Value Ref Range Status  07/18/2018 103 (H) 65 - 99 mg/dL Final    Comment:    .  Fasting reference interval . For someone without known diabetes, a glucose value between 100 and 125 mg/dL is consistent with prediabetes and should be confirmed with a follow-up test. .   06/28/2016 97 65 - 99 mg/dL Final  10/15/2012 82 70 - 99 mg/dL Final   Lipids:  Has just gotten back into taking statin regularly; will check lipids again in 3 months Lab Results  Component Value Date   CHOL 130 07/18/2018   CHOL 173 12/18/2017   CHOL 153 06/28/2016   Lab Results  Component Value Date   HDL 39 (L) 07/18/2018   HDL 37 (L) 12/18/2017   HDL 38 (L) 06/28/2016   Lab Results  Component Value Date   LDLCALC 74 07/18/2018   LDLCALC 97 12/18/2017   LDLCALC 81 06/28/2016   Lab Results  Component Value Date   TRIG 91 07/18/2018   TRIG 197 (H) 12/18/2017   TRIG 172 (H) 06/28/2016   Lab Results  Component Value Date   CHOLHDL 3.3 07/18/2018   CHOLHDL 4.0 06/28/2016   CHOLHDL 4.7  12/31/2015   Lab Results  Component Value Date   LDLDIRECT 83.6 10/21/2011     Review of Systems  Past Medical History:  Diagnosis Date  . COPD (chronic obstructive pulmonary disease) ([place])   . ED (erectile dysfunction)   . 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 History of gastritis    not currently an issue  . 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 History of kidney stones last 2013  . Hyperlipidemia   . Hypogonadism male   . Smoker   . Vitamin D deficiency     Past Surgical History:  Procedure Laterality Date  . CARDIOVASCULAR STRESS TEST  2010   myoview, WNL  . COLONOSCOPY  09/2007   WNL, rpt 10 yrs Andrew Mccarthy) [place]  . COLONOSCOPY WITH PROPOFOL N/A 01/10/2018   Procedure: COLONOSCOPY WITH PROPOFOL;  Surgeon: [place], [name], MD;  Location: [facility] ENDOSCOPY;  Service: Gastroenterology;  Laterality: N/A;  . ESOPHAGOGASTRODUODENOSCOPY  09/2007   WNL,   . Kidney Stone Retraction  1993  . LITHOTRIPSY  01/2012  . PILONIDAL CYST EXCISION  2003    Family History  Problem Relation Age of Onset  . Cancer Father 42       prostate  . Cancer Sister        breast  . Rheum arthritis Sister   . Alzheimer's disease Mother   . Cancer Son 76       colon  . Coronary artery disease Neg Hx   . Stroke Neg Hx   . Diabetes Neg Hx     Social History   Tobacco Use  . Smoking status: Former Smoker    Packs/day: 2.00    Years: 35.00    Pack years: 70.00    Types: Cigarettes    Start date: 07/16/2017  . Smokeless tobacco: Never Used  Substance Use Topics  . Alcohol use: No  . Drug use: No    Outpatient Encounter Medications as of 07/19/2018  Medication Sig  . albuterol (PROVENTIL HFA;VENTOLIN HFA) 108 (90 Base) MCG/ACT inhaler Inhale 2 puffs into the lungs every 6 (six) hours as needed for wheezing or shortness of breath.  Marland Kitchen aspirin 81 MG tablet Take 81 mg by mouth daily.  . rosuvastatin (CRESTOR) 10 MG tablet TAKE 1 TABLET BY MOUTH EVERYDAY AT BEDTIME  . ADVAIR DISKUS 250-50  MCG/DOSE AEPB INHALE 1 PUFF INTO THE LUNGS 2 (TWO) TIMES  DAILY.  . cholecalciferol (VITAMIN D) 1000 UNITS tablet Take 1,000 Units by mouth daily. Reported on 01/19/2016  . vitamin B-12 (CYANOCOBALAMIN) 500 MCG tablet Take 1,000 mcg by mouth daily.  . Vitamin D, Ergocalciferol, (DRISDOL) 50000 units CAPS capsule Take 1 capsule (50,000 Units total) by mouth every 7 (seven) days. For 12 weeks. (Patient not taking: Reported on 07/19/2018)   No facility-administered encounter medications on file as of 07/19/2018.     Functional Ability / Safety Screening 1.  Was the timed Get Up and Go test less than 12 seconds?  yes 2.  Does the patient need help with the phone, transportation, shopping,      preparing meals, housework, laundry, medications, or managing money?  no 3.  Does the patient's home have:  loose throw rugs in the hallway?   no      Grab bars in the bathroom? no      Handrails on the stairs?   yes in the front      Good lighting?   yes 4.  Has the patient noticed any hearing difficulties?   yes; open invitation to audiology for testing  Advanced Directives Does patient have a HCPOA?    no If yes, name and contact information: wife, [phone] cell, [phone] home Does patient have a living will or MOST form?  no   Fall Risk Assessment See under rooming  Depression Screen See under rooming Depression screen [facility] 2/9 07/19/2018 11/15/2017 01/03/2017 08/02/2016 01/19/2016  Decreased Interest 0 0 0 0 0  Down, Depressed, Hopeless 0 0 0 0 0  PHQ - 2 Score 0 0 0 0 0  Altered sleeping 2 - - - -  Tired, decreased energy 1 - - - -  Change in appetite 1 - - - -  Feeling bad or failure about yourself  0 - - - -  Trouble concentrating 0 - - - -  Moving slowly or fidgety/restless 0 - - - -  Suicidal thoughts 0 - - - -  PHQ-9 Score 4 - - - -  Difficult doing work/chores Not difficult at all - - - -    Objective:   Vitals: BP 126/82   Pulse 63   Temp 98.1 F (36.7 C) (Oral)   Resp 12    Ht _0  (1.727 m)   Wt 197 lb 4.8 oz (89.5 kg)   SpO2 98%   BMI 30.00 kg/m  Body mass index is 30 kg/m. No exam data present  Physical Exam  Constitutional: He appears well-developed and well-nourished. No distress.  HENT:  Head: Normocephalic and atraumatic.  Nose: Nose normal.  Mouth/Throat: Oropharynx is clear and moist.  Eyes: EOM are normal. No scleral icterus.  Neck: No JVD present. No thyromegaly present.  Cardiovascular: Regular rhythm and normal heart sounds. Bradycardia present.  Pulmonary/Chest: Effort normal and breath sounds normal. No respiratory distress. He has no wheezes. He has no rales.  Abdominal: Soft. Bowel sounds are normal. He exhibits no distension. There is no tenderness. There is no guarding.  Musculoskeletal: Normal range of motion. He exhibits no edema.  Lymphadenopathy:    He has no cervical adenopathy.  Neurological: He is alert. He displays normal reflexes. He exhibits normal muscle tone. Coordination normal.  Skin: Skin is warm and dry. No rash noted. He is not diaphoretic. No erythema. No pallor.  Psychiatric: He has a normal mood and affect. His behavior is normal. Judgment and thought content normal.    6CIT Screen 07/19/2018  What Year? 0 points  What month? 0 points  What time? 0 points  Count back from 20 0 points  Months in reverse 0 points  Repeat phrase 0 points  Total Score 0    Assessment & Plan:     Annual Wellness Visit  Reviewed patient's Family Medical History Reviewed and updated list of patient's medical providers Assessment of cognitive impairment was done Assessed patient's functional ability Established a written schedule for health screening [place] Completed and Reviewed  Exercise Activities and Dietary recommendations Goals    . Weight (lb) < 181 lb (82.1 kg) (pt-stated)       Immunization History  Administered Date(s) Administered  . Tdap 10/15/2012  . Zoster 12/31/2015     Health Maintenance  Topic Date Due  . HIV Screening  11/15/2018 (Originally 08/04/1966)  . INFLUENZA VACCINE  03/08/2019 (Originally 06/07/2018)  . COLONOSCOPY  01/10/2021  . TETANUS/TDAP  10/15/2022  . Hepatitis C Screening  Completed    Discussed health benefits of physical activity, and encouraged him to engage in regular exercise appropriate for his age and condition.   No orders of the defined types were placed in this encounter.   Current Outpatient Medications:  .  albuterol (PROVENTIL HFA;VENTOLIN HFA) 108 (90 Base) MCG/ACT inhaler, Inhale 2 puffs into the lungs every 6 (six) hours as needed for wheezing or shortness of breath., Disp: 1 Inhaler, Rfl: 0 .  aspirin 81 MG tablet, Take 81 mg by mouth daily., Disp: , Rfl:  .  rosuvastatin (CRESTOR) 10 MG tablet, TAKE 1 TABLET BY MOUTH EVERYDAY AT BEDTIME, Disp: 90 tablet, Rfl: 0 .  ADVAIR DISKUS 250-50 MCG/DOSE AEPB, INHALE 1 PUFF INTO THE LUNGS 2 (TWO) TIMES DAILY., Disp: 180 each, Rfl: 0 .  cholecalciferol (VITAMIN D) 1000 UNITS tablet, Take 1,000 Units by mouth daily. Reported on 01/19/2016, Disp: , Rfl:  .  vitamin B-12 (CYANOCOBALAMIN) 500 MCG tablet, Take 1,000 mcg by mouth daily., Disp: , Rfl:  .  Vitamin D, Ergocalciferol, (DRISDOL) 50000 units CAPS capsule, Take 1 capsule (50,000 Units total) by mouth every 7 (seven) days. For 12 weeks. (Patient not taking: Reported on 07/19/2018), Disp: 12 capsule, Rfl: 0 There are no discontinued medications.  Next Medicare Wellness Visit in 12+ months  Problem List Items Addressed This Visit      Cardiovascular and Mediastinum   Coronary artery disease    Patient to see Dr. Ubaldo Glassing; continue baby aspirin      Relevant Orders   Ambulatory referral to Cardiology   Aortic atherosclerosis [facility])    Former smoker; explained with aortic model; continue statin        Respiratory   COPD (chronic obstructive pulmonary disease) ([place])    Managed by Dr. Raul Del        Other   Elevated  alkaline phosphatase level   Relevant Orders   Alkaline Phosphatase Isoenzymes   Welcome to Medicare preventive visit - Primary    USPSTF grade A and B recommendations reviewed with patient; age-appropriate recommendations, preventive care, screening tests, etc discussed and encouraged; healthy living encouraged; see AVS for patient education given to patient       Relevant Orders   EKG 12-Lead (Completed)   Obesity (BMI 30.0-34.9)    Try to lose just modest weight       Other Visit Diagnoses    Bradycardia       Relevant Orders   TSH   Hx of tobacco use, presenting hazards to  health       Relevant Orders   [place] Retroperitoneal Comp

## 2018-07-19 NOTE — Assessment & Plan Note (Signed)
Managed by Dr. Fleming 

## 2018-07-19 NOTE — Patient Instructions (Addendum)
Try to limit saturated fats in your diet (bologna, hot dogs, barbeque, cheeseburgers, hamburgers, steak, bacon, sausage, cheese, etc.) and get more fresh fruits, vegetables, and whole grains  Continue your cholesterol medicine and return to get that rechecked in 3 months; schedule a visit here  Check out the information at familydoctor.org entitled "Nutrition for Weight Loss: What You Need to Know about Fad Diets" Try to lose between 1-2 pounds per week by taking in fewer calories and burning off more calories You can succeed by limiting portions, limiting foods dense in calories and fat, becoming more active, and drinking 8 glasses of water a day (64 ounces) Don't skip meals, especially breakfast, as skipping meals may alter your metabolism Do not use over-the-counter weight loss pills or gimmicks that claim rapid weight loss A healthy BMI (or body mass index) is between 18.5 and 24.9 You can calculate your ideal BMI at the [place] website ClubMonetize.fr  Please return after your 65th birthday to get the PCV-13 (pneumonia vaccine); here  I'll suggest getting the shingles vaccine at your pharmacy at least one month after getting the PCV-13 pneumonia shot  Please see the cardiologist at [facility]: September 18th at 3:00 pm  I do recommend yearly flu shots; for individuals who don't want flu shots, try to practice excellent hand hygiene, and avoid nursing homes, day cares, and hospitals during peak flu season; taking additional vitamin C daily during flu/cold season may help boost your immune system too   Health Maintenance, Male A healthy lifestyle and preventive care is important for your health and wellness. Ask your health care provider about what schedule of regular examinations is right for you. What should I know about weight and diet? Eat a Healthy Diet  Eat plenty of vegetables, fruits, whole grains, low-fat dairy products, and  lean protein.  Do not eat a lot of foods high in solid fats, added sugars, or salt.  Maintain a Healthy Weight Regular exercise can help you achieve or maintain a healthy weight. You should:  Do at least 150 minutes of exercise each week. The exercise should increase your heart rate and make you sweat (moderate-intensity exercise).  Do strength-training exercises at least twice a week.  Watch Your Levels of Cholesterol and Blood Lipids  Have your blood tested for lipids and cholesterol every 5 years starting at 65 years of age. If you are at high risk for heart disease, you should start having your blood tested when you are 66 years old. You may need to have your cholesterol levels checked more often if: ? Your lipid or cholesterol levels are high. ? You are older than 65 years of age. ? You are at high risk for heart disease.  What should I know about cancer screening? Many types of cancers can be detected early and may often be prevented. Lung Cancer  You should be screened every year for lung cancer if: ? You are a current smoker who has smoked for at least 30 years. ? You are a former smoker who has quit within the past 15 years.  Talk to your health care provider about your screening options, when you should start screening, and how often you should be screened.  Colorectal Cancer  Routine colorectal cancer screening usually begins at 65 years of age and should be repeated every 5-10 years until you are 65 years old. You may need to be screened more often if early forms of precancerous polyps or small growths are found. Your health  care provider may recommend screening at an earlier age if you have risk factors for colon cancer.  Your health care provider may recommend using home test kits to check for hidden blood in the stool.  A small camera at the end of a tube can be used to examine your colon (sigmoidoscopy or colonoscopy). This checks for the earliest forms of colorectal  cancer.  Prostate and Testicular Cancer  Depending on your age and overall health, your health care provider may do certain tests to screen for prostate and testicular cancer.  Talk to your health care provider about any symptoms or concerns you have about testicular or prostate cancer.  Skin Cancer  Check your skin from head to toe regularly.  Tell your health care provider about any new moles or changes in moles, especially if: ? There is a change in a mole's size, shape, or color. ? You have a mole that is larger than a pencil eraser.  Always use sunscreen. Apply sunscreen liberally and repeat throughout the day.  Protect yourself by wearing long sleeves, pants, a wide-brimmed hat, and sunglasses when outside.  What should I know about heart disease, diabetes, and high blood pressure?  If you are 87-12 years of age, have your blood pressure checked every 3-5 years. If you are 39 years of age or older, have your blood pressure checked every year. You should have your blood pressure measured twice-once when you are at a hospital or clinic, and once when you are not at a hospital or clinic. Record the average of the two measurements. To check your blood pressure when you are not at a hospital or clinic, you can use: ? An automated blood pressure machine at a pharmacy. ? A home blood pressure monitor.  Talk to your health care provider about your target blood pressure.  If you are between 47-35 years old, ask your health care provider if you should take aspirin to prevent heart disease.  Have regular diabetes screenings by checking your fasting blood sugar level. ? If you are at a normal weight and have a low risk for diabetes, have this test once every three years after the age of 63. ? If you are overweight and have a high risk for diabetes, consider being tested at a younger age or more often.  A one-time screening for abdominal aortic aneurysm (AAA) by ultrasound is recommended  for men aged 27-75 years who are current or former smokers. What should I know about preventing infection? Hepatitis B If you have a higher risk for hepatitis B, you should be screened for this virus. Talk with your health care provider to find out if you are at risk for hepatitis B infection. Hepatitis C Blood testing is recommended for:  Everyone born from [birth year] through 1963.  Anyone with known risk factors for hepatitis C.  Sexually Transmitted Diseases (STDs)  You should be screened each year for STDs including gonorrhea and chlamydia if: ? You are sexually active and are younger than 65 years of age. ? You are older than 65 years of age and your health care provider tells you that you are at risk for this type of infection. ? Your sexual activity has changed since you were last screened and you are at an increased risk for chlamydia or gonorrhea. Ask your health care provider if you are at risk.  Talk with your health care provider about whether you are at high risk of being infected with HIV. Your health  care provider may recommend a prescription medicine to help prevent HIV infection.  What else can I do?  Schedule regular health, dental, and eye exams.  Stay current with your vaccines (immunizations).  Do not use any tobacco products, such as cigarettes, chewing tobacco, and e-cigarettes. If you need help quitting, ask your health care provider.  Limit alcohol intake to no more than 2 drinks per day. One drink equals 12 ounces of beer, 5 ounces of wine, or 1 ounces of hard liquor.  Do not use street drugs.  Do not share needles.  Ask your health care provider for help if you need support or information about quitting drugs.  Tell your health care provider if you often feel depressed.  Tell your health care provider if you have ever been abused or do not feel safe at home. This information is not intended to replace advice given to you by your health care provider. Make  sure you discuss any questions you have with your health care provider. Document Released: 04/21/2008 Document Revised: 06/22/2016 Document Reviewed: 07/28/2015 Elsevier Interactive Patient Education  [employer].

## 2018-07-20 NOTE — Telephone Encounter (Signed)
Pt.notified

## 2018-07-20 NOTE — Telephone Encounter (Signed)
The ultrasound is of the aorta In all males who are 58 or old who have smoked at least 100 cigarettes in their lifetime, it is recommended that they get an ultrasound to check for an abdominal aortic aneurysm He should not have this scheduled until after his 67th birthday (to my understanding for screening purposes)

## 2018-07-23 LAB — COMPREHENSIVE METABOLIC PANEL
AG Ratio: 1.4 (calc) (ref 1.0–2.5)
ALBUMIN MSPROF: 3.9 g/dL (ref 3.6–5.1)
ALT: 26 U/L (ref 9–46)
AST: 24 U/L (ref 10–35)
Alkaline phosphatase (APISO): 125 U/L — ABNORMAL HIGH (ref 40–115)
BUN: 15 mg/dL (ref 7–25)
CHLORIDE: 106 mmol/L (ref 98–110)
CO2: 24 mmol/L (ref 20–32)
CREATININE: 1.04 mg/dL (ref 0.70–1.25)
Calcium: 9.3 mg/dL (ref 8.6–10.3)
GLOBULIN: 2.8 g/dL (ref 1.9–3.7)
GLUCOSE: 103 mg/dL — AB (ref 65–99)
Potassium: 4.6 mmol/L (ref 3.5–5.3)
Sodium: 138 mmol/L (ref 135–146)
Total Bilirubin: 0.7 mg/dL (ref 0.2–1.2)
Total Protein: 6.7 g/dL (ref 6.1–8.1)

## 2018-07-23 LAB — TEST AUTHORIZATION

## 2018-07-23 LAB — LIPID PANEL
CHOL/HDL RATIO: 3.3 (calc) (ref <5.0)
Cholesterol: 130 mg/dL (ref <200)
HDL: 39 mg/dL — AB (ref >40)
LDL Cholesterol (Calc): 74 mg/dL (calc)
NON-HDL CHOLESTEROL (CALC): 91 mg/dL (ref <130)
Triglycerides: 91 mg/dL (ref <150)

## 2018-07-23 LAB — ALKALINE PHOSPHATASE ISOENZYMES
ALKALINE PHOSPHATASE (APISO): 128 U/L — AB (ref 40–115)
Bone Isoenzymes: 65 % (ref 28–66)
INTESTINAL ISOENZYMES (ALP ISO): 0 % — AB (ref 1–24)
Liver Isoenzymes: 35 % (ref 25–69)

## 2018-07-23 LAB — VITAMIN D 25 HYDROXY (VIT D DEFICIENCY, FRACTURES): VIT D 25 HYDROXY: 36 ng/mL (ref 30–100)

## 2018-07-23 LAB — TSH: TSH: 1.71 mIU/L (ref 0.40–4.50)

## 2018-07-25 DIAGNOSIS — F411 Generalized anxiety disorder: Secondary | ICD-10-CM | POA: Insufficient documentation

## 2018-07-27 ENCOUNTER — Ambulatory Visit: Payer: Self-pay

## 2018-08-13 ENCOUNTER — Ambulatory Visit: Payer: Self-pay

## 2018-08-13 ENCOUNTER — Ambulatory Visit
Admission: RE | Admit: 2018-08-13 | Discharge: 2018-08-13 | Disposition: A | Discharge status: Home / Self Care | Payer: Medicare Other | Source: Ambulatory Visit | Attending: Family Medicine | Admitting: Family Medicine

## 2018-08-13 ENCOUNTER — Other Ambulatory Visit: Payer: Self-pay | Admitting: Family Medicine

## 2018-08-13 DIAGNOSIS — Z87891 Personal history of nicotine dependence: Secondary | ICD-10-CM | POA: Diagnosis not present

## 2018-08-13 DIAGNOSIS — I77811 Abdominal aortic ectasia: Secondary | ICD-10-CM | POA: Diagnosis not present

## 2018-08-14 ENCOUNTER — Ambulatory Visit: Payer: 59 | Admitting: Family Medicine

## 2018-08-14 ENCOUNTER — Ambulatory Visit (INDEPENDENT_AMBULATORY_CARE_PROVIDER_SITE_OTHER): Payer: Medicare Other

## 2018-08-14 DIAGNOSIS — Z23 Encounter for immunization: Secondary | ICD-10-CM

## 2018-08-15 ENCOUNTER — Encounter: Payer: Self-pay | Admitting: Family Medicine

## 2018-08-15 ENCOUNTER — Other Ambulatory Visit: Payer: Self-pay | Admitting: Family Medicine

## 2018-08-15 DIAGNOSIS — E781 Pure hyperglyceridemia: Secondary | ICD-10-CM

## 2018-08-15 DIAGNOSIS — I77811 Abdominal aortic ectasia: Secondary | ICD-10-CM

## 2018-08-15 DIAGNOSIS — I700 Atherosclerosis of aorta: Secondary | ICD-10-CM

## 2018-08-15 HISTORY — DX: Abdominal aortic ectasia: I77.811

## 2018-08-15 NOTE — Progress Notes (Signed)
Recheck lipids in 12 weeks

## 2018-09-05 ENCOUNTER — Other Ambulatory Visit: Payer: Self-pay | Admitting: Nurse Practitioner

## 2018-09-05 DIAGNOSIS — E781 Pure hyperglyceridemia: Secondary | ICD-10-CM

## 2018-09-05 DIAGNOSIS — E785 Hyperlipidemia, unspecified: Secondary | ICD-10-CM

## 2019-06-17 ENCOUNTER — Telehealth: Payer: Self-pay | Admitting: *Deleted

## 2019-06-17 NOTE — Telephone Encounter (Signed)
Patient has been notified that lung cancer screening CT scan is due currently or will be in near future. Confirmed that patient is within the appropriate age range, and asymptomatic, (no signs or symptoms of lung cancer). Patient denies illness that would prevent curative treatment for lung cancer if found. Verified smoking history (Former Smoker). Patient is agreeable for CT scan being scheduled. Prefers to be contacted regarding appointment at the beginning of next week.  He is currently out of town.

## 2019-07-01 ENCOUNTER — Telehealth: Payer: Self-pay | Admitting: *Deleted

## 2019-07-01 DIAGNOSIS — Z122 Encounter for screening for malignant neoplasm of respiratory organs: Secondary | ICD-10-CM

## 2019-07-01 DIAGNOSIS — Z87891 Personal history of nicotine dependence: Secondary | ICD-10-CM

## 2019-07-01 NOTE — Telephone Encounter (Signed)
Patient has been notified that annual lung cancer screening low dose CT scan is due currently or will be in near future. Confirmed that patient is within the age range of 55-77, and asymptomatic, (no signs or symptoms of lung cancer). Patient denies illness that would prevent curative treatment for lung cancer if found. Verified smoking history, (former, quit 09/2015, 70 pack year). The shared decision making visit was done 06/21/16. Patient is agreeable for CT scan being scheduled.

## 2019-07-03 ENCOUNTER — Other Ambulatory Visit: Payer: Self-pay

## 2019-07-03 ENCOUNTER — Ambulatory Visit: Admission: RE | Admit: 2019-07-03 | Payer: Medicare Other | Source: Ambulatory Visit

## 2019-07-10 ENCOUNTER — Other Ambulatory Visit: Payer: Self-pay

## 2019-07-10 ENCOUNTER — Ambulatory Visit
Admission: RE | Admit: 2019-07-10 | Discharge: 2019-07-10 | Disposition: A | Discharge status: Home / Self Care | Payer: Medicare Other | Source: Ambulatory Visit | Attending: Nurse Practitioner | Admitting: Nurse Practitioner

## 2019-07-10 DIAGNOSIS — Z87891 Personal history of nicotine dependence: Secondary | ICD-10-CM | POA: Diagnosis present

## 2019-07-10 DIAGNOSIS — Z122 Encounter for screening for malignant neoplasm of respiratory organs: Secondary | ICD-10-CM | POA: Diagnosis not present

## 2019-07-12 ENCOUNTER — Encounter: Payer: Self-pay | Admitting: *Deleted

## 2019-08-06 ENCOUNTER — Telehealth: Payer: Self-pay

## 2019-08-06 NOTE — Telephone Encounter (Signed)
Copied from [place] [phone]. Topic: General - Other >> Aug 02, 2019  3:20 PM Rainey Pines A wrote: Patient would like to speak with nurse in regards to patients energy levels and being tired all the time. Patient has seen a urologist for this in the past but patients wife would like to know if he should go straight to a urologist or have labs ordered from the office.

## 2019-08-07 NOTE — Telephone Encounter (Signed)
Typically we do an in office evaluation unless there is a know issues such as low testosterone.  Please schedule office visit to evaluate further. Thanks!

## 2019-08-09 ENCOUNTER — Encounter: Payer: Self-pay | Admitting: Family Medicine

## 2019-08-09 ENCOUNTER — Other Ambulatory Visit: Payer: Self-pay

## 2019-08-09 ENCOUNTER — Ambulatory Visit (INDEPENDENT_AMBULATORY_CARE_PROVIDER_SITE_OTHER): Payer: Medicare Other | Admitting: Family Medicine

## 2019-08-09 VITALS — BP 122/64 | HR 60 | Temp 98.3°F | Resp 14 | Ht 68.0 in | Wt 204.3 lb

## 2019-08-09 DIAGNOSIS — Z5181 Encounter for therapeutic drug level monitoring: Secondary | ICD-10-CM

## 2019-08-09 DIAGNOSIS — R202 Paresthesia of skin: Secondary | ICD-10-CM | POA: Diagnosis not present

## 2019-08-09 DIAGNOSIS — I700 Atherosclerosis of aorta: Secondary | ICD-10-CM

## 2019-08-09 DIAGNOSIS — E291 Testicular hypofunction: Secondary | ICD-10-CM

## 2019-08-09 DIAGNOSIS — E785 Hyperlipidemia, unspecified: Secondary | ICD-10-CM

## 2019-08-09 DIAGNOSIS — R0683 Snoring: Secondary | ICD-10-CM

## 2019-08-09 DIAGNOSIS — J449 Chronic obstructive pulmonary disease, unspecified: Secondary | ICD-10-CM

## 2019-08-09 DIAGNOSIS — R5383 Other fatigue: Secondary | ICD-10-CM | POA: Diagnosis not present

## 2019-08-09 DIAGNOSIS — R6889 Other general symptoms and signs: Secondary | ICD-10-CM

## 2019-08-09 DIAGNOSIS — G4700 Insomnia, unspecified: Secondary | ICD-10-CM

## 2019-08-09 DIAGNOSIS — E669 Obesity, unspecified: Secondary | ICD-10-CM

## 2019-08-09 DIAGNOSIS — E559 Vitamin D deficiency, unspecified: Secondary | ICD-10-CM

## 2019-08-09 DIAGNOSIS — R7303 Prediabetes: Secondary | ICD-10-CM

## 2019-08-09 MED ORDER — TRAZODONE HCL 50 MG PO TABS: 25.0000 mg | ORAL_TABLET | Freq: Every evening | ORAL | 3 refills | Status: DC | PRN

## 2019-08-09 NOTE — Patient Instructions (Signed)
Try the trazodone and we'll touch base in one month to see how its working.  WE will call you with your lab results and any new changes or treatments at that time.  Sleep study ordered - you will be contacted to set up  Fatigue If you have fatigue, you feel tired all the time and have a lack of energy or a lack of motivation. Fatigue may make it difficult to start or complete tasks because of exhaustion. In general, occasional or mild fatigue is often a normal response to activity or life. However, long-lasting (chronic) or extreme fatigue may be a symptom of a medical condition. Follow these instructions at home: General instructions  Watch your fatigue for any changes.  Go to bed and get up at the same time every day.  Avoid fatigue by pacing yourself during the day and getting enough sleep at night.  Maintain a healthy weight. Medicines  Take over-the-counter and prescription medicines only as told by your health care provider.  Take a multivitamin, if told by your health care provider.  Do not use herbal or dietary supplements unless they are approved by your health care provider. Activity   Exercise regularly, as told by your health care provider.  Use or practice techniques to help you relax, such as yoga, tai chi, meditation, or massage therapy. Eating and drinking   Avoid heavy meals in the evening.  Eat a well-balanced diet, which includes lean proteins, whole grains, plenty of fruits and vegetables, and low-fat dairy products.  Avoid consuming too much caffeine.  Avoid the use of alcohol.  Drink enough fluid to keep your urine pale yellow. Lifestyle  Change situations that cause you stress. Try to keep your work and personal schedule in balance.  Do not use any products that contain nicotine or tobacco, such as cigarettes and e-cigarettes. If you need help quitting, ask your health care provider.  Do not use drugs. Contact a health care provider if:   Your fatigue does not get better.  You have a fever.  You suddenly lose or gain weight.  You have headaches.  You have trouble falling asleep or sleeping through the night.  You feel angry, guilty, anxious, or sad.  You are unable to have a bowel movement (constipation).  Your skin is dry.  You have swelling in your legs or another part of your body. Get help right away if:  You feel confused.  Your vision is blurry.  You feel faint or you pass out.  You have a severe headache.  You have severe pain in your abdomen, your back, or the area between your waist and hips (pelvis).  You have chest pain, shortness of breath, or an irregular or fast heartbeat.  You are unable to urinate, or you urinate less than normal.  You have abnormal bleeding, such as bleeding from the rectum, vagina, nose, lungs, or nipples.  You vomit blood.  You have thoughts about hurting yourself or others. If you ever feel like you may hurt yourself or others, or have thoughts about taking your own life, get help right away. You can go to your nearest emergency department or call:  Your local emergency services (911 in the [place]  A suicide crisis helpline, such as the [facility] at 5625501395. This is open 24 hours a day. Summary  If you have fatigue, you feel tired all the time and have a lack of energy or a lack of motivation.  Fatigue may  make it difficult to start or complete tasks because of exhaustion.  Long-lasting (chronic) or extreme fatigue may be a symptom of a medical condition.  Exercise regularly, as told by your health care provider.  Change situations that cause you stress. Try to keep your work and personal schedule in balance. This information is not intended to replace advice given to you by your health care provider. Make sure you discuss any questions you have with your health care provider. Document Released: 08/21/2007 Document Revised:  02/14/2019 Document Reviewed: 07/19/2017 Elsevier Patient Education  2020 [employer].

## 2019-08-09 NOTE — Progress Notes (Signed)
Patient ID: Andrew Mccarthy, male    DOB: 1951/10/21, 68 y.o.   MRN: [id number]  PCP: Andrew Courser, MD  Chief Complaint  Patient presents with  . Fatigue    no energy, hx of low testosterone    Subjective:   Andrew Mccarthy is a 68 y.o. male, presents to clinic with CC of the following:  HPI   Fatigued - getting more and more tired, having to nap during the day, not sleeping well at night 10-11pm wakes up at 7 am, he goes to sleep  But wakes up mutliple times doesn't feel like   Cardiac - stress test just done, and that was good, he denies any cardiac symptoms including no palpitations, lower extremity edema, near syncope, exertional dyspnea, orthopnea, chest pain Smoking hx - had his Ct screening test - more than 35 years of smoking, 2 packs a day, CT scan was Pulmonary - Andrew Mccarthy    Moods good, denies feeling down, depressed or anxious Hx of emphysema  Hx of low T, low vit D  Depression screen [facility] 2/9 08/09/2019 07/19/2018 11/15/2017  Decreased Interest 0 0 0  Down, Depressed, Hopeless 0 0 0  PHQ - 2 Score 0 0 0  Altered sleeping 3 2 -  Tired, decreased energy 0 1 -  Change in appetite 0 1 -  Feeling bad or failure about yourself  0 0 -  Trouble concentrating 0 0 -  Moving slowly or fidgety/restless 0 0 -  Suicidal thoughts 0 0 -  PHQ-9 Score 3 4 -  Difficult doing work/chores Not difficult at all Not difficult at all -     Patient Active Problem List   Diagnosis Date Noted  . Abdominal aortic ectasia ([place]) 08/15/2018  . Welcome to Medicare preventive visit 07/19/2018  . Coronary artery disease 07/19/2018  . Obesity (BMI 30.0-34.9) 07/19/2018  . Coronary artery calcification 07/15/2018  . Elevated alkaline phosphatase level 07/15/2018  . History of smoking 30 or more pack years 03/19/2018  . Aortic atherosclerosis ([place]) 06/28/2016  . Hyperglycemia 06/28/2016  . Obesity 03/10/2016  . Hypertriglyceridemia 01/19/2016  . Sinus bradycardia on ECG 12/31/2015  .  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 COPD (chronic obstructive pulmonary disease) ([place]) 12/09/2015  . Vitamin D deficiency   . History of kidney stones   . Hypogonadism male   . ED (erectile dysfunction)       Current Outpatient Medications:  .  ADVAIR DISKUS 250-50 MCG/DOSE AEPB, INHALE 1 PUFF INTO THE LUNGS 2 (TWO) TIMES DAILY., Disp: 180 each, Rfl: 0 .  albuterol (PROVENTIL HFA;VENTOLIN HFA) 108 (90 Base) MCG/ACT inhaler, Inhale 2 puffs into the lungs every 6 (six) hours as needed for wheezing or shortness of breath. (Patient not taking: Reported on 08/09/2019), Disp: 1 Inhaler, Rfl: 0 .  aspirin 81 MG tablet, Take 81 mg by mouth daily., Disp: , Rfl:  .  cholecalciferol (VITAMIN D) 1000 UNITS tablet, Take 1,000 Units by mouth daily. Reported on 01/19/2016, Disp: , Rfl:  .  rosuvastatin (CRESTOR) 10 MG tablet, TAKE 1 TABLET BY MOUTH EVERYDAY AT BEDTIME (Patient not taking: Reported on 08/09/2019), Disp: 90 tablet, Rfl: 0 .  vitamin B-12 (CYANOCOBALAMIN) 500 MCG tablet, Take 1,000 mcg by mouth daily., Disp: , Rfl:  .  Vitamin D, Ergocalciferol, (DRISDOL) 50000 units CAPS capsule, Take 1 capsule (50,000 Units total) by mouth every 7 (seven) days. For 12 weeks. (Patient not taking: Reported on 07/19/2018), Disp: 12 capsule, Rfl: 0  Allergies  Allergen Reactions  . Chantix [Varenicline] Rash     Family History  Problem Relation Age of Onset  . Cancer Father 44       prostate  . Cancer Sister        breast  . Rheum arthritis Sister   . Alzheimer's disease Mother   . Cancer Son 41       colon  . Coronary artery disease Neg Hx   . Stroke Neg Hx   . Diabetes Neg Hx      Social History   Socioeconomic History  . Marital status: Married    Spouse name: Andrew Mccarthy  . Number of children: 3  . Years of education: 3  . Highest education level: 12th grade  Occupational History  . Occupation: retired  Scientific laboratory technician  . Financial resource strain: Not hard at all  . Food insecurity     Worry: Never true    Inability: Never true  . Transportation needs    Medical: No    Non-medical: No  Tobacco Use  . Smoking status: Former Smoker    Packs/day: 2.00    Years: 35.00    Pack years: 70.00    Types: Cigarettes    Start date: 07/16/2017  . Smokeless tobacco: Never Used  Substance and Sexual Activity  . Alcohol use: No  . Drug use: No  . Sexual activity: Never  Lifestyle  . Physical activity    Days per week: 0 days    Minutes per session: 0 min  . Stress: Not at all  Relationships  . Social connections    Talks on phone: More than three times a week    Gets together: Once a week    Attends religious service: 1 to 4 times per year    Active member of club or organization: No    Attends meetings of clubs or organizations: Never    Relationship status: Married  . Intimate partner violence    Fear of current or ex partner: No    Emotionally abused: No    Physically abused: No    Forced sexual activity: No  Other Topics Concern  . Not on file  Social History Narrative   Caffeine: 2 cups coffee/day, 4-5 mountain dew/day.   Lives with wife Andrew Mccarthy)   Occupation: Games developer, maintenance with [employer]   Edu: HS   Activity: no regular exercise, stays active at job   Diet: fruits/vegetables occasionally, lots of mountain dew    I personally reviewed active problem list, medication list, allergies, family history, social history, health maintenance, notes from last encounter, lab results with the patient/caregiver today.  Review of Systems  Constitutional: Negative.   HENT: Negative.   Eyes: Negative.   Respiratory: Negative.   Cardiovascular: Negative.   Gastrointestinal: Negative.   Endocrine: Negative.   Genitourinary: Negative.   Musculoskeletal: Negative.   Skin: Negative.   Allergic/Immunologic: Negative.   Neurological: Negative.   Hematological: Negative.   Psychiatric/Behavioral: Negative.   All other systems reviewed and are negative.       Objective:   Vitals:   08/09/19 0846  BP: 122/64  Pulse: 60  Resp: 14  Temp: 98.3 F (36.8 C)  SpO2: 94%  Weight: 204 lb 4.8 oz (92.7 kg)  Height: 5\' 8"  (1.727 m)    Body mass index is 31.06 kg/m.  Physical Exam Vitals signs and nursing note reviewed.  Constitutional:      General: He is not in acute distress.  Appearance: Normal appearance. He is well-developed. He is not ill-appearing or toxic-appearing.  HENT:     Head: Normocephalic and atraumatic.     Jaw: No trismus.     Right Ear: Tympanic membrane, ear canal and external ear normal.     Left Ear: Tympanic membrane, ear canal and external ear normal.     Nose: Nose normal. No mucosal edema or rhinorrhea.     Right Sinus: No maxillary sinus tenderness or frontal sinus tenderness.     Left Sinus: No maxillary sinus tenderness or frontal sinus tenderness.     Mouth/Throat:     Mouth: Mucous membranes are moist.     Pharynx: Oropharynx is clear. Uvula midline. No oropharyngeal exudate, posterior oropharyngeal erythema or uvula swelling.  Eyes:     General: Lids are normal. No scleral icterus.       Right eye: No discharge.        Left eye: No discharge.     Conjunctiva/sclera: Conjunctivae normal.     Pupils: Pupils are equal, round, and reactive to light.  Neck:     Musculoskeletal: Normal range of motion and neck supple.     Trachea: Trachea and phonation normal. No tracheal deviation.  Cardiovascular:     Rate and Rhythm: Normal rate and regular rhythm.     Pulses: Normal pulses.          Radial pulses are 2+ on the right side and 2+ on the left side.       Posterior tibial pulses are 2+ on the right side and 2+ on the left side.     Heart sounds: Normal heart sounds. No murmur. No friction rub. No gallop.   Pulmonary:     Effort: Pulmonary effort is normal.     Breath sounds: Normal breath sounds. No wheezing, rhonchi or rales.  Abdominal:     General: Bowel sounds are normal. There is no distension.      Palpations: Abdomen is soft.     Tenderness: There is no abdominal tenderness. There is no guarding or rebound.  Musculoskeletal: Normal range of motion.     Right lower leg: No edema.     Left lower leg: No edema.  Skin:    General: Skin is warm and dry.     Capillary Refill: Capillary refill takes less than 2 seconds.     Coloration: Skin is not jaundiced or pale.     Findings: No bruising, erythema or rash.  Neurological:     Mental Status: He is alert and oriented to person, place, and time.     Motor: No weakness.     Coordination: Coordination normal.     Gait: Gait normal.  Psychiatric:        Mood and Affect: Mood normal.        Speech: Speech normal.        Behavior: Behavior normal.      Results for orders placed or performed in visit on 07/16/18  Vitamin D (25 hydroxy)  Result Value Ref Range   Vit D, 25-Hydroxy 36 30 - 100 ng/mL  Comprehensive Metabolic Panel (CMET)  Result Value Ref Range   Glucose, Bld 103 (H) 65 - 99 mg/dL   BUN 15 7 - 25 mg/dL   Creat 1.04 0.70 - 1.25 mg/dL   BUN/Creatinine Ratio NOT APPLICABLE 6 - 22 (calc)   Sodium 138 135 - 146 mmol/L   Potassium 4.6 3.5 - 5.3 mmol/L   Chloride 106  98 - 110 mmol/L   CO2 24 20 - 32 mmol/L   Calcium 9.3 8.6 - 10.3 mg/dL   Total Protein 6.7 6.1 - 8.1 g/dL   Albumin 3.9 3.6 - 5.1 g/dL   Globulin 2.8 1.9 - 3.7 g/dL (calc)   AG Ratio 1.4 1.0 - 2.5 (calc)   Total Bilirubin 0.7 0.2 - 1.2 mg/dL   Alkaline phosphatase (APISO) 125 (H) 40 - 115 U/L   AST 24 10 - 35 U/L   ALT 26 9 - 46 U/L  Lipid panel  Result Value Ref Range   Cholesterol 130 <200 mg/dL   HDL 39 (L) >40 mg/dL   Triglycerides 91 <150 mg/dL   LDL Cholesterol (Calc) 74 mg/dL (calc)   Total CHOL/HDL Ratio 3.3 <5.0 (calc)   Non-HDL Cholesterol (Calc) 91 <130 mg/dL (calc)  Alkaline Phosphatase Isoenzymes  Result Value Ref Range   Alkaline phosphatase (APISO) 128 (H) 40 - 115 U/L   Intestinal Isoenzymes 0 (L) 1 - 24 %   Bone Isoenzymes 65 28 -  66 %   Liver Isoenzymes 35 25 - 69 %  TSH  Result Value Ref Range   TSH 1.71 0.40 - 4.50 mIU/L  TEST AUTHORIZATION  Result Value Ref Range   TEST NAME: ALKALINE PHOSPHATASE ISOENZYM    TEST CODE: [id number]    CLIENT CONTACT: CINDY CANDY    REPORT ALWAYS MESSAGE SIGNATURE          Assessment & Plan:      ICD-10-CM   1. Fatigue, unspecified type  R53.83 TSH    CBC with Differential/Platelet    COMPLETE METABOLIC PANEL WITH GFR    Hemoglobin A1c    VITAMIN D 25 Hydroxy (Vit-D Deficiency, Fractures)    Testosterone    T4, free    B12 and Folate Panel    Iron, TIBC and Ferritin Panel   r/o anemia, B12, Vit D deficiency, low T - sx are not suspicious for cardiac or pulm etiology, possibly OSA  2. Paresthesias  R20.2 TSH    VITAMIN D 25 Hydroxy (Vit-D Deficiency, Fractures)    T4, free    B12 and Folate Panel    Iron, TIBC and Ferritin Panel  3. Aortic atherosclerosis (HCC)  I70.0    Per his CT lung cancer screening test  4. Chronic obstructive pulmonary disease, unspecified COPD type ([place])  J44.9    Emphysema, sees pulmonary no worsening pulmonary symptoms  5. Vitamin D deficiency  E55.9 VITAMIN D 25 Hydroxy (Vit-D Deficiency, Fractures)  6. Obesity (BMI 30.0-34.9)  E66.9   7. Medication monitoring encounter  Z51.81 CBC with Differential/Platelet    COMPLETE METABOLIC PANEL WITH GFR    Hemoglobin A1c    Lipid panel    VITAMIN D 25 Hydroxy (Vit-D Deficiency, Fractures)  8. Dyslipidemia  [id number] COMPLETE METABOLIC PANEL WITH GFR    Lipid panel   hx of, recheck labs  9. Insomnia, unspecified type  G47.00 traZODone (DESYREL) 50 MG tablet    Ambulatory referral to Sleep Studies   poor sleep - wakes up a lot  10. Snoring  R06.83 Ambulatory referral to Sleep Studies  11. Prediabetes  [id number] COMPLETE METABOLIC PANEL WITH GFR    Hemoglobin A1c   recheck labs and r/o DM - no sx consistent with         Delsa Grana, PA-C 08/09/19 9:06 AM

## 2019-08-10 LAB — CBC WITH DIFFERENTIAL/PLATELET
Absolute Monocytes: 610 cells/uL (ref 200–950)
Basophils Absolute: 60 cells/uL (ref 0–200)
Basophils Relative: 0.9 %
Eosinophils Absolute: 221 cells/uL (ref 15–500)
Eosinophils Relative: 3.3 %
HCT: 44.6 % (ref 38.5–50.0)
Hemoglobin: 15.2 g/dL (ref 13.2–17.1)
Lymphs Abs: 2010 cells/uL (ref 850–3900)
MCH: 30.8 pg (ref 27.0–33.0)
MCHC: 34.1 g/dL (ref 32.0–36.0)
MCV: 90.3 fL (ref 80.0–100.0)
MPV: 11.1 fL (ref 7.5–12.5)
Monocytes Relative: 9.1 %
Neutro Abs: 3799 cells/uL (ref 1500–7800)
Neutrophils Relative %: 56.7 %
Platelets: 225 10*3/uL (ref 140–400)
RBC: 4.94 10*6/uL (ref 4.20–5.80)
RDW: 13.3 % (ref 11.0–15.0)
Total Lymphocyte: 30 %
WBC: 6.7 10*3/uL (ref 3.8–10.8)

## 2019-08-10 LAB — TESTOSTERONE: Testosterone: 245 ng/dL — ABNORMAL LOW (ref 250–827)

## 2019-08-10 LAB — LIPID PANEL
Cholesterol: 114 mg/dL (ref <200)
HDL: 37 mg/dL — ABNORMAL LOW (ref >=40)
LDL Cholesterol (Calc): 58 mg/dL (calc)
Non-HDL Cholesterol (Calc): 77 mg/dL (calc) (ref <130)
Total CHOL/HDL Ratio: 3.1 (calc) (ref <5.0)
Triglycerides: 108 mg/dL (ref <150)

## 2019-08-10 LAB — COMPLETE METABOLIC PANEL WITH GFR
AG Ratio: 1.3 (calc) (ref 1.0–2.5)
ALT: 34 U/L (ref 9–46)
AST: 27 U/L (ref 10–35)
Albumin: 3.9 g/dL (ref 3.6–5.1)
Alkaline phosphatase (APISO): 118 U/L (ref 35–144)
BUN: 17 mg/dL (ref 7–25)
CO2: 25 mmol/L (ref 20–32)
Calcium: 9.5 mg/dL (ref 8.6–10.3)
Chloride: 106 mmol/L (ref 98–110)
Creat: 1.03 mg/dL (ref 0.70–1.25)
GFR, Est African American: 87 mL/min/{1.73_m2} (ref >=60)
GFR, Est Non African American: 75 mL/min/{1.73_m2} (ref >=60)
Globulin: 3.1 g/dL (calc) (ref 1.9–3.7)
Glucose, Bld: 113 mg/dL — ABNORMAL HIGH (ref 65–99)
Potassium: 4.7 mmol/L (ref 3.5–5.3)
Sodium: 139 mmol/L (ref 135–146)
Total Bilirubin: 0.6 mg/dL (ref 0.2–1.2)
Total Protein: 7 g/dL (ref 6.1–8.1)

## 2019-08-10 LAB — HEMOGLOBIN A1C
Hgb A1c MFr Bld: 6 % of total Hgb — ABNORMAL HIGH (ref <5.7)
Mean Plasma Glucose: 126 (calc)
eAG (mmol/L): 7 (calc)

## 2019-08-10 LAB — B12 AND FOLATE PANEL
Folate: 6.4 ng/mL
Vitamin B-12: 469 pg/mL (ref 200–1100)

## 2019-08-10 LAB — VITAMIN D 25 HYDROXY (VIT D DEFICIENCY, FRACTURES): Vit D, 25-Hydroxy: 34 ng/mL (ref 30–100)

## 2019-08-10 LAB — IRON,TIBC AND FERRITIN PANEL
%SAT: 28 % (calc) (ref 20–48)
Ferritin: 267 ng/mL (ref 24–380)
Iron: 82 ug/dL (ref 50–180)
TIBC: 294 mcg/dL (calc) (ref 250–425)

## 2019-08-10 LAB — TSH: TSH: 1.14 mIU/L (ref 0.40–4.50)

## 2019-08-10 LAB — T4, FREE: Free T4: 1.2 ng/dL (ref 0.8–1.8)

## 2019-08-12 ENCOUNTER — Telehealth: Payer: Self-pay

## 2019-08-12 DIAGNOSIS — R7989 Other specified abnormal findings of blood chemistry: Secondary | ICD-10-CM

## 2019-08-12 NOTE — Telephone Encounter (Signed)
-----   Message from Delsa Grana, [place] sent at 08/11/2019 11:17 PM EDT ----- Please call pt with labs  Testosterone is low - please refer to urology for Dx of Low Testosterone for further tests/work up  His A1C is a little high - prediabetes, working on avoiding sugars/carbs or any weight loss/increasing exercise will help improve Otherwise no deficiencies, no anemia, Vit D and thyroid normal

## 2019-08-16 ENCOUNTER — Other Ambulatory Visit: Payer: Self-pay | Admitting: Family Medicine

## 2019-08-16 DIAGNOSIS — G4700 Insomnia, unspecified: Secondary | ICD-10-CM

## 2019-08-19 ENCOUNTER — Telehealth: Payer: Self-pay | Admitting: Family Medicine

## 2019-08-19 NOTE — Telephone Encounter (Signed)
The patient's wife called and said the patient was diagnosed with a low testosterone levels and they would like to know how he is going to be treated for it.

## 2019-08-20 NOTE — Telephone Encounter (Signed)
Pt notified referral has been placed.  

## 2019-09-09 ENCOUNTER — Ambulatory Visit: Payer: Medicare Other | Admitting: Family Medicine

## 2019-09-13 ENCOUNTER — Ambulatory Visit: Payer: Medicare Other | Admitting: Urology

## 2019-09-20 ENCOUNTER — Ambulatory Visit (INDEPENDENT_AMBULATORY_CARE_PROVIDER_SITE_OTHER): Payer: Medicare Other

## 2019-09-20 DIAGNOSIS — Z0000 Encounter for general adult medical examination without abnormal findings: Secondary | ICD-10-CM

## 2019-09-20 NOTE — Patient Instructions (Signed)
Andrew Mccarthy , Thank you for taking time to come for your Medicare Wellness Visit. I appreciate your ongoing commitment to your health goals. Please review the following plan we discussed and let me know if I can assist you in the future.   Screening recommendations/referrals: Colonoscopy: done 01/10/18. Repeat in 2022. Recommended yearly ophthalmology/optometry visit for glaucoma screening and checkup Recommended yearly dental visit for hygiene and checkup  Vaccinations: Influenza vaccine: postponed Pneumococcal vaccine: done 08/14/18. Due for second dose.  Tdap vaccine: done 10/15/12 Shingles vaccine: Shingrix discussed. Please contact your pharmacy for coverage information.   Advanced directives: Advance directive discussed with you today. Even though you declined this today please call our office should you change your mind and we can give you the proper paperwork for you to fill out.  Conditions/risks identified: Recommend increasing physical activity  Next appointment: Please follow up in one year for your Medicare Annual Wellness visit.    Preventive Care 66 Years and Older, Male Preventive care refers to lifestyle choices and visits with your health care provider that can promote health and wellness. What does preventive care include?  A yearly physical exam. This is also called an annual well check.  Dental exams once or twice a year.  Routine eye exams. Ask your health care provider how often you should have your eyes checked.  Personal lifestyle choices, including:  Daily care of your teeth and gums.  Regular physical activity.  Eating a healthy diet.  Avoiding tobacco and drug use.  Limiting alcohol use.  Practicing safe sex.  Taking low doses of aspirin every day.  Taking vitamin and mineral supplements as recommended by your health care provider. What happens during an annual well check? The services and screenings done by your health care provider during your  annual well check will depend on your age, overall health, lifestyle risk factors, and family history of disease. Counseling  Your health care provider may ask you questions about your:  Alcohol use.  Tobacco use.  Drug use.  Emotional well-being.  Home and relationship well-being.  Sexual activity.  Eating habits.  History of falls.  Memory and ability to understand (cognition).  Work and work Statistician. Screening  You may have the following tests or measurements:  Height, weight, and BMI.  Blood pressure.  Lipid and cholesterol levels. These may be checked every 5 years, or more frequently if you are over 66 years old.  Skin check.  Lung cancer screening. You may have this screening every year starting at age 66 if you have a 30-pack-year history of smoking and currently smoke or have quit within the past 15 years.  Fecal occult blood test (FOBT) of the stool. You may have this test every year starting at age 66.  Flexible sigmoidoscopy or colonoscopy. You may have a sigmoidoscopy every 5 years or a colonoscopy every 10 years starting at age 66.  Prostate cancer screening. Recommendations will vary depending on your family history and other risks.  Hepatitis C blood test.  Hepatitis B blood test.  Sexually transmitted disease (STD) testing.  Diabetes screening. This is done by checking your blood sugar (glucose) after you have not eaten for a while (fasting). You may have this done every 1-3 years.  Abdominal aortic aneurysm (AAA) screening. You may need this if you are a current or former smoker.  Osteoporosis. You may be screened starting at age 40 if you are at high risk. Talk with your health care provider about your test  results, treatment options, and if necessary, the need for more tests. Vaccines  Your health care provider may recommend certain vaccines, such as:  Influenza vaccine. This is recommended every year.  Tetanus, diphtheria, and  acellular pertussis (Tdap, Td) vaccine. You may need a Td booster every 10 years.  Zoster vaccine. You may need this after age 66.  Pneumococcal 13-valent conjugate (PCV13) vaccine. One dose is recommended after age 66.  Pneumococcal polysaccharide (PPSV23) vaccine. One dose is recommended after age 66. Talk to your health care provider about which screenings and vaccines you need and how often you need them. This information is not intended to replace advice given to you by your health care provider. Make sure you discuss any questions you have with your health care provider. Document Released: 11/20/2015 Document Revised: 07/13/2016 Document Reviewed: 08/25/2015 Elsevier Interactive Patient Education  2017 [employer] Prevention in the Home Falls can cause injuries. They can happen to people of all ages. There are many things you can do to make your home safe and to help prevent falls. What can I do on the outside of my home?  Regularly fix the edges of walkways and driveways and fix any cracks.  Remove anything that might make you trip as you walk through a door, such as a raised step or threshold.  Trim any bushes or trees on the path to your home.  Use bright outdoor lighting.  Clear any walking paths of anything that might make someone trip, such as rocks or tools.  Regularly check to see if handrails are loose or broken. Make sure that both sides of any steps have handrails.  Any raised decks and porches should have guardrails on the edges.  Have any leaves, snow, or ice cleared regularly.  Use sand or salt on walking paths during winter.  Clean up any spills in your garage right away. This includes oil or grease spills. What can I do in the bathroom?  Use night lights.  Install grab bars by the toilet and in the tub and shower. Do not use towel bars as grab bars.  Use non-skid mats or decals in the tub or shower.  If you need to sit down in the shower, use  a plastic, non-slip stool.  Keep the floor dry. Clean up any water that spills on the floor as soon as it happens.  Remove soap buildup in the tub or shower regularly.  Attach bath mats securely with double-sided non-slip rug tape.  Do not have throw rugs and other things on the floor that can make you trip. What can I do in the bedroom?  Use night lights.  Make sure that you have a light by your bed that is easy to reach.  Do not use any sheets or blankets that are too big for your bed. They should not hang down onto the floor.  Have a firm chair that has side arms. You can use this for support while you get dressed.  Do not have throw rugs and other things on the floor that can make you trip. What can I do in the kitchen?  Clean up any spills right away.  Avoid walking on wet floors.  Keep items that you use a lot in easy-to-reach places.  If you need to reach something above you, use a strong step stool that has a grab bar.  Keep electrical cords out of the way.  Do not use floor polish or wax that makes  floors slippery. If you must use wax, use non-skid floor wax.  Do not have throw rugs and other things on the floor that can make you trip. What can I do with my stairs?  Do not leave any items on the stairs.  Make sure that there are handrails on both sides of the stairs and use them. Fix handrails that are broken or loose. Make sure that handrails are as long as the stairways.  Check any carpeting to make sure that it is firmly attached to the stairs. Fix any carpet that is loose or worn.  Avoid having throw rugs at the top or bottom of the stairs. If you do have throw rugs, attach them to the floor with carpet tape.  Make sure that you have a light switch at the top of the stairs and the bottom of the stairs. If you do not have them, ask someone to add them for you. What else can I do to help prevent falls?  Wear shoes that:  Do not have high heels.  Have  rubber bottoms.  Are comfortable and fit you well.  Are closed at the toe. Do not wear sandals.  If you use a stepladder:  Make sure that it is fully opened. Do not climb a closed stepladder.  Make sure that both sides of the stepladder are locked into place.  Ask someone to hold it for you, if possible.  Clearly mark and make sure that you can see:  Any grab bars or handrails.  First and last steps.  Where the edge of each step is.  Use tools that help you move around (mobility aids) if they are needed. These include:  Canes.  Walkers.  Scooters.  Crutches.  Turn on the lights when you go into a dark area. Replace any light bulbs as soon as they burn out.  Set up your furniture so you have a clear path. Avoid moving your furniture around.  If any of your floors are uneven, fix them.  If there are any pets around you, be aware of where they are.  Review your medicines with your doctor. Some medicines can make you feel dizzy. This can increase your chance of falling. Ask your doctor what other things that you can do to help prevent falls. This information is not intended to replace advice given to you by your health care provider. Make sure you discuss any questions you have with your health care provider. Document Released: 08/20/2009 Document Revised: 03/31/2016 Document Reviewed: 11/28/2014 Elsevier Interactive Patient Education  2017 [employer].

## 2019-09-20 NOTE — Progress Notes (Signed)
Subjective:   Andrew Mccarthy is a 68 y.o. male who presents for Medicare Annual/Subsequent preventive examination.  Virtual Visit via Telephone Note  I connected with Andrew Mccarthy on 09/20/19 at  9:20 AM EST by telephone and verified that I am speaking with the correct person using two identifiers.  Medicare Annual Wellness visit completed telephonically due to Covid-19 pandemic.   Location: Patient: home Provider: office   I discussed the limitations, risks, security and privacy concerns of performing an evaluation and management service by telephone and the availability of in person appointments. The patient expressed understanding and agreed to proceed.  Some vital signs may be absent or patient reported.   Andrew Marker, LPN    Review of Systems:   Cardiac Risk Factors include: advanced age (>63men, >32 women);dyslipidemia;male gender     Objective:    Vitals: There were no vitals taken for this visit.  There is no height or weight on file to calculate BMI.  Advanced Directives 09/20/2019 01/10/2018 08/25/2017 01/03/2017 09/21/2016 08/02/2016 06/28/2016  Does Patient Have a Medical Advance Directive? No No No No No No No  Would patient like information on creating a medical advance directive? No - Patient declined - - - No - patient declined information No - patient declined information No - patient declined information    Tobacco Social History   Tobacco Use  Smoking Status Former Smoker  . Packs/day: 2.00  . Years: 35.00  . Pack years: 70.00  . Types: Cigarettes  . Start date: 07/16/2017  Smokeless Tobacco Never Used     Counseling given: Not Answered   Clinical Intake:  Pre-visit preparation completed: Yes  Pain : No/denies pain     Nutritional Risks: None Diabetes: No  How often do you need to have someone help you when you read instructions, pamphlets, or other written materials from your doctor or pharmacy?: 1 - Never  Interpreter Needed?: No   Information entered by :: Andrew Marker LPN  Past Medical History:  Diagnosis Date  . Abdominal aortic ectasia ([place]) 08/15/2018  . COPD (chronic obstructive pulmonary disease) ([place])   . ED (erectile dysfunction)   . 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 History of gastritis    not currently an issue  . 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 History of kidney stones last 2013  . Hyperlipidemia   . Hypogonadism male   . Smoker   . Vitamin D deficiency    Past Surgical History:  Procedure Laterality Date  . CARDIOVASCULAR STRESS TEST  2010   myoview, WNL  . COLONOSCOPY  09/2007   WNL, rpt 10 yrs Andrew Mccarthy) [place]  . COLONOSCOPY WITH PROPOFOL N/A 01/10/2018   Procedure: COLONOSCOPY WITH PROPOFOL;  Surgeon: Andrew Mccarthy, Andrew Pike, Andrew Mccarthy;  Location: [facility] ENDOSCOPY;  Service: Gastroenterology;  Laterality: N/A;  . ESOPHAGOGASTRODUODENOSCOPY  09/2007   WNL,   . Kidney Stone Retraction  1993  . LITHOTRIPSY  01/2012  . PILONIDAL CYST EXCISION  2003   Family History  Problem Relation Age of Onset  . Cancer Father 43       prostate  . Cancer Sister        breast  . Rheum arthritis Sister   . Alzheimer's disease Mother   . Cancer Son 58       colon  . Coronary artery disease Neg Hx   . Stroke Neg Hx   . Diabetes Neg Hx    Social History  Socioeconomic History  . Marital status: Married    Spouse name: Andrew Mccarthy  . Number of children: 3  . Years of education: 16  . Highest education level: 12th grade  Occupational History  . Occupation: retired  Scientific laboratory technician  . Financial resource strain: Not hard at all  . Food insecurity    Worry: Never true    Inability: Never true  . Transportation needs    Medical: No    Non-medical: No  Tobacco Use  . Smoking status: Former Smoker    Packs/day: 2.00    Years: 35.00    Pack years: 70.00    Types: Cigarettes    Start date: 07/16/2017  . Smokeless tobacco: Never Used  Substance and Sexual Activity  . Alcohol use: No  . Drug use: No  . Sexual  activity: Never  Lifestyle  . Physical activity    Days per week: 0 days    Minutes per session: 0 min  . Stress: Not at all  Relationships  . Social connections    Talks on phone: More than three times a week    Gets together: Once a week    Attends religious service: 1 to 4 times per year    Active member of club or organization: No    Attends meetings of clubs or organizations: Never    Relationship status: Married  Other Topics Concern  . Not on file  Social History Narrative   Caffeine: 2 cups coffee/day, 4-5 mountain dew/day.   Lives with wife Andrew Mccarthy)   Occupation: Games developer, maintenance with [employer]   Edu: HS   Activity: no regular exercise, stays active at job   Diet: fruits/vegetables occasionally, lots of mountain dew    Outpatient Encounter Medications as of 09/20/2019  Medication Sig  . ADVAIR DISKUS 250-50 MCG/DOSE AEPB INHALE 1 PUFF INTO THE LUNGS 2 (TWO) TIMES DAILY.  Marland Kitchen albuterol (PROVENTIL HFA;VENTOLIN HFA) 108 (90 Base) MCG/ACT inhaler Inhale 2 puffs into the lungs every 6 (six) hours as needed for wheezing or shortness of breath. (Patient not taking: Reported on 08/09/2019)  . aspirin 81 MG tablet Take 81 mg by mouth daily.  . cholecalciferol (VITAMIN D) 1000 UNITS tablet Take 1,000 Units by mouth daily. Reported on 01/19/2016  . fluticasone (FLONASE) 50 MCG/ACT nasal spray Place 2 sprays into both nostrils daily.  . rosuvastatin (CRESTOR) 10 MG tablet TAKE 1 TABLET BY MOUTH EVERYDAY AT BEDTIME (Patient not taking: Reported on 08/09/2019)  . traZODone (DESYREL) 50 MG tablet TAKE 1/2 TO 2 TABLETS (25-100 MG TOTAL) BY MOUTH AT BEDTIME AS NEEDED FOR SLEEP. (Patient not taking: Reported on 09/20/2019)  . vitamin B-12 (CYANOCOBALAMIN) 500 MCG tablet Take 1,000 mcg by mouth daily.  . Vitamin D, Ergocalciferol, (DRISDOL) 50000 units CAPS capsule Take 1 capsule (50,000 Units total) by mouth every 7 (seven) days. For 12 weeks. (Patient not taking: Reported on 07/19/2018)    No facility-administered encounter medications on file as of 09/20/2019.     Activities of Daily Living In your present state of health, do you have any difficulty performing the following activities: 09/20/2019 08/09/2019  Hearing? Y Y  Comment declines hearing aids -  Vision? N N  Difficulty concentrating or making decisions? N N  Walking or climbing stairs? N N  Dressing or bathing? N N  Doing errands, shopping? N N  Preparing Food and eating ? N -  Using the Toilet? N -  In the past six months, have you accidently leaked urine?  N -  Do you have problems with loss of bowel control? N -  Managing your Medications? N -  Managing your Finances? N -  Housekeeping or managing your Housekeeping? N -  Some recent data might be hidden    Patient Care Team: Delsa Grana, PA-C as PCP - General (Family Medicine) Ubaldo Glassing Javier Docker, Andrew Mccarthy as Consulting Physician (Cardiology) Erby Pian, Andrew Mccarthy as Referring Physician (Specialist)   Assessment:   This is a routine wellness examination for Dandre.  Exercise Activities and Dietary recommendations Current Exercise Habits: The patient does not participate in regular exercise at present, Exercise limited by: None identified  Goals    . Weight (lb) < 181 lb (82.1 kg) (pt-stated)       Fall Risk Fall Risk  09/20/2019 08/09/2019 07/19/2018 11/15/2017 01/03/2017  Falls in the past year? 0 0 No No No  Number falls in past yr: 0 0 - - -  Injury with Fall? 0 0 - - -  Follow up Falls prevention discussed - - - -   FALL RISK PREVENTION PERTAINING TO THE HOME:  Any stairs in or around the home? Yes  If so, do they handrails? Yes   Home free of loose throw rugs in walkways, pet beds, electrical cords, etc? Yes  Adequate lighting in your home to reduce risk of falls? Yes   ASSISTIVE DEVICES UTILIZED TO PREVENT FALLS:  Life alert? No  Use of a cane, walker or w/c? No  Grab bars in the bathroom? Yes  Shower chair or bench in shower? No  Elevated  toilet seat or a handicapped toilet? No   DME ORDERS:  DME order needed?  No   TIMED UP AND GO:  Was the test performed? No . Telephonic visit.   Education: Fall risk prevention has been discussed.  Intervention(s) required? No   Depression Screen PHQ 2/9 Scores 09/20/2019 08/09/2019 07/19/2018 11/15/2017  PHQ - 2 Score 0 0 0 0  PHQ- 9 Score - 3 4 -    Cognitive Function - pt declined 6CIT for 2020 AWV     6CIT Screen 07/19/2018  What Year? 0 points  What month? 0 points  What time? 0 points  Count back from 20 0 points  Months in reverse 0 points  Repeat phrase 0 points  Total Score 0    Immunization History  Administered Date(s) Administered  . Pneumococcal Conjugate-13 08/14/2018  . Tdap 10/15/2012  . Zoster 12/31/2015    Qualifies for Shingles Vaccine? Yes  Zostavax completed 2017. Due for Shingrix. Education has been provided regarding the importance of this vaccine. Pt has been advised to call insurance company to determine out of pocket expense. Advised may also receive vaccine at local pharmacy or Health Dept. Verbalized acceptance and understanding.  Tdap: Up to date  Flu Vaccine: Due for Flu vaccine. Does the patient want to receive this vaccine today?  No . Education has been provided regarding the importance of this vaccine but still declined. Advised may receive this vaccine at local pharmacy or Health Dept. Aware to provide a copy of the vaccination record if obtained from local pharmacy or Health Dept. Verbalized acceptance and understanding.  Pneumococcal Vaccine: Due for Pneumococcal vaccine. Does the patient want to receive this vaccine today?  No . Education has been provided regarding the importance of this vaccine but still declined. Advised may receive this vaccine at local pharmacy or Health Dept. Aware to provide a copy of the vaccination record if obtained  from local pharmacy or Health Dept. Verbalized acceptance and understanding.   Screening Tests  Health Maintenance  Topic Date Due  . PNA vac Low Risk Adult (2 of 2 - PPSV23) 08/15/2019  . INFLUENZA VACCINE  02/05/2020 (Originally 06/08/2019)  . COLONOSCOPY  01/10/2021  . TETANUS/TDAP  10/15/2022  . Hepatitis C Screening  Completed   Cancer Screenings:  Colorectal Screening: Completed 01/10/18. Repeat every 3 years;   Lung Cancer Screening: (Low Dose CT Chest recommended if Age 41-80 years, 30 pack-year currently smoking OR have quit w/in 15years.) does qualify. Completed 07/10/19   Additional Screening:  Hepatitis C Screening: does qualify; Completed 12/18/17  Vision Screening: Recommended annual ophthalmology exams for early detection of glaucoma and other disorders of the eye. Is the patient up to date with their annual eye exam?  Yes  Who is the provider or what is the name of the office in which the pt attends annual eye exams? [place] Screening: Recommended annual dental exams for proper oral hygiene  Community Resource Referral:  CRR required this visit?  No       Plan:    I have personally reviewed and addressed the Medicare Annual Wellness questionnaire and have noted the following in the patient's chart:  A. Medical and social history B. Use of alcohol, tobacco or illicit drugs  C. Current medications and supplements D. Functional ability and status E.  Nutritional status F.  Physical activity G. Advance directives H. List of other physicians I.  Hospitalizations, surgeries, and ER visits in previous 12 months J.  [place] such as hearing and vision if needed, cognitive and depression L. Referrals and appointments   In addition, I have reviewed and discussed with patient certain preventive protocols, quality metrics, and best practice recommendations. A written personalized care plan for preventive services as well as general preventive health recommendations were provided to patient.   Signed,  Andrew Marker, LPN Nurse Health  Advisor   Nurse Notes: none

## 2019-09-29 ENCOUNTER — Emergency Department
Admission: EM | Admit: 2019-09-29 | Discharge: 2019-09-29 | Disposition: A | Discharge status: Home / Self Care | Payer: Medicare Other | Attending: Emergency Medicine | Admitting: Emergency Medicine

## 2019-09-29 DIAGNOSIS — J449 Chronic obstructive pulmonary disease, unspecified: Secondary | ICD-10-CM | POA: Diagnosis not present

## 2019-09-29 DIAGNOSIS — Z7982 Long term (current) use of aspirin: Secondary | ICD-10-CM | POA: Insufficient documentation

## 2019-09-29 DIAGNOSIS — Z87891 Personal history of nicotine dependence: Secondary | ICD-10-CM | POA: Insufficient documentation

## 2019-09-29 DIAGNOSIS — I2510 Atherosclerotic heart disease of native coronary artery without angina pectoris: Secondary | ICD-10-CM | POA: Insufficient documentation

## 2019-09-29 DIAGNOSIS — Z79899 Other long term (current) drug therapy: Secondary | ICD-10-CM | POA: Insufficient documentation

## 2019-09-29 DIAGNOSIS — H5713 Ocular pain, bilateral: Secondary | ICD-10-CM | POA: Diagnosis present

## 2019-09-29 DIAGNOSIS — H16133 Photokeratitis, bilateral: Secondary | ICD-10-CM | POA: Diagnosis not present

## 2019-09-29 MED ORDER — ERYTHROMYCIN 5 MG/GM OP OINT
TOPICAL_OINTMENT | Freq: Once | OPHTHALMIC | Status: AC
  Administered 2019-09-29: 1 via OPHTHALMIC
  Filled 2019-09-29: qty 1

## 2019-09-29 MED ORDER — TETRACAINE HCL 0.5 % OP SOLN
1.0000 [drp] | Freq: Once | OPHTHALMIC | Status: AC
  Administered 2019-09-29: 1 [drp] via OPHTHALMIC
  Filled 2019-09-29: qty 4

## 2019-09-29 MED ORDER — OXYCODONE-ACETAMINOPHEN 5-325 MG PO TABS
1.0000 | ORAL_TABLET | Freq: Once | ORAL | Status: AC
  Administered 2019-09-29: 1 via ORAL
  Filled 2019-09-29: qty 1

## 2019-09-29 MED ORDER — FLUORESCEIN SODIUM 1 MG OP STRP
ORAL_STRIP | OPHTHALMIC | Status: AC
  Administered 2019-09-29: 1 via OPHTHALMIC
  Filled 2019-09-29: qty 2

## 2019-09-29 MED ORDER — OXYCODONE-ACETAMINOPHEN 5-325 MG PO TABS: 1.0000 | ORAL_TABLET | ORAL | 0 refills | Status: DC | PRN

## 2019-09-29 MED ORDER — FLUORESCEIN SODIUM 1 MG OP STRP
1.0000 | ORAL_STRIP | Freq: Once | OPHTHALMIC | Status: AC
  Administered 2019-09-29: 02:00:00 1 via OPHTHALMIC
  Filled 2019-09-29: qty 1

## 2019-09-29 NOTE — ED Notes (Signed)
ED Provider at bedside.  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

## 2019-09-29 NOTE — ED Provider Notes (Signed)
[facility] Emergency Department Provider Note   ____________________________________________   I have reviewed the triage vital signs and the nursing notes.   HISTORY  Chief Complaint Eye Pain   History limited by: Not Limited   HPI Andrew Mccarthy is a 68 y.o. male who presents to the emergency department today because of concern for bilateral eye pain. The patient states that he was in a garage earlier today and someone else was welding. He says he did not have prolonged exposure. Roughly 11 hours later however he is having significant pain in bilateral eyes. Has been having tearing and photophobia. Denies any contact lens use. Cannot think of any other chemical exposure or abnormal exposure to his eyes.   Records reviewed. Per medical record review patient has a history of COPD.   Past Medical History:  Diagnosis Date  . Abdominal aortic ectasia ([place]) 08/15/2018  . COPD (chronic obstructive pulmonary disease) ([place])   . ED (erectile dysfunction)   . 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 History of gastritis    not currently an issue  . 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 History of kidney stones last 2013  . Hyperlipidemia   . Hypogonadism male   . Smoker   . Vitamin D deficiency     Patient Active Problem List   Diagnosis Date Noted  . Abdominal aortic ectasia ([place]) 08/15/2018  . Anxiety state 07/25/2018  . Welcome to Medicare preventive visit 07/19/2018  . Coronary artery disease 07/19/2018  . Obesity (BMI 30.0-34.9) 07/19/2018  . Coronary artery calcification 07/15/2018  . Elevated alkaline phosphatase level 07/15/2018  . History of smoking 30 or more pack years 03/19/2018  . Aortic atherosclerosis ([place]) 06/28/2016  . Hyperglycemia 06/28/2016  . Obesity 03/10/2016  . Hypertriglyceridemia 01/19/2016  . Sinus bradycardia on ECG 12/31/2015  . 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 COPD (chronic obstructive pulmonary disease) ([place]) 12/09/2015  .  Atypical chest pain 07/04/2014  . Vitamin D deficiency   . History of kidney stones   . Hypogonadism male   . ED (erectile dysfunction)     Past Surgical History:  Procedure Laterality Date  . CARDIOVASCULAR STRESS TEST  2010   myoview, WNL  . COLONOSCOPY  09/2007   WNL, rpt 10 yrs [name]) [place]  . COLONOSCOPY WITH PROPOFOL N/A 01/10/2018   Procedure: COLONOSCOPY WITH PROPOFOL;  Surgeon: Toledo, [name], MD;  Location: [facility] ENDOSCOPY;  Service: Gastroenterology;  Laterality: N/A;  . ESOPHAGOGASTRODUODENOSCOPY  09/2007   WNL,   . Kidney Stone Retraction  1993  . LITHOTRIPSY  01/2012  . PILONIDAL CYST EXCISION  2003    Prior to Admission medications   Medication Sig Start Date End Date Taking? Authorizing Provider  ADVAIR DISKUS 250-50 MCG/DOSE AEPB INHALE 1 PUFF INTO THE LUNGS 2 (TWO) TIMES DAILY. 04/12/17 11/15/17  Roselee Nova, MD  albuterol (PROVENTIL HFA;VENTOLIN HFA) 108 (90 Base) MCG/ACT inhaler Inhale 2 puffs into the lungs every 6 (six) hours as needed for wheezing or shortness of breath. Patient not taking: Reported on 08/09/2019 12/31/15   Roselee Nova, MD  aspirin 81 MG tablet Take 81 mg by mouth daily.    [provider]  cholecalciferol (VITAMIN D) 1000 UNITS tablet Take 1,000 Units by mouth daily. Reported on 01/19/2016    [provider]  fluticasone (FLONASE) 50 MCG/ACT nasal spray Place 2 sprays into both nostrils daily. 08/26/19   [provider]  oxyCODONE-acetaminophen (  PERCOCET) 5-325 MG tablet Take 1 tablet by mouth every 4 (four) hours as needed for severe pain. 09/29/19 09/28/20  Nance Pear, MD  rosuvastatin (CRESTOR) 10 MG tablet TAKE 1 TABLET BY MOUTH EVERYDAY AT BEDTIME Patient not taking: Reported on 08/09/2019 09/05/18   Fredderick Severance, NP  traZODone (DESYREL) 50 MG tablet TAKE 1/2 TO 2 TABLETS (25-100 MG TOTAL) BY MOUTH AT BEDTIME AS NEEDED FOR SLEEP. Patient not taking: Reported on 09/20/2019 08/16/19   Delsa Grana, PA-C  vitamin B-12 (CYANOCOBALAMIN) 500 MCG tablet Take 1,000 mcg by mouth daily.    [provider]  Vitamin D, Ergocalciferol, (DRISDOL) 50000 units CAPS capsule Take 1 capsule (50,000 Units total) by mouth every 7 (seven) days. For 12 weeks. Patient not taking: Reported on 07/19/2018 12/21/17   Roselee Nova, MD    Allergies Chantix [varenicline]  Family History  Problem Relation Age of Onset  . Cancer Father 32       prostate  . Cancer Sister        breast  . Rheum arthritis Sister   . Alzheimer's disease Mother   . Cancer Son 28       colon  . Coronary artery disease Neg Hx   . Stroke Neg Hx   . Diabetes Neg Hx     Social History Social History   Tobacco Use  . Smoking status: Former Smoker    Packs/day: 2.00    Years: 35.00    Pack years: 70.00    Types: Cigarettes    Start date: 07/16/2017  . Smokeless tobacco: Never Used  Substance Use Topics  . Alcohol use: No  . Drug use: No    Review of Systems Constitutional: No fever/chills Eyes: Positive for bilateral eye pain.  ENT: No sore throat. Cardiovascular: Denies chest pain. Respiratory: Denies shortness of breath. Gastrointestinal: No abdominal pain.  No nausea, no vomiting.  No diarrhea.   Genitourinary: Negative for dysuria. Musculoskeletal: Negative for back pain. Skin: Negative for rash. Neurological: Negative for headaches, focal weakness or numbness.  ____________________________________________   PHYSICAL EXAM:  VITAL SIGNS: ED Triage Vitals  Enc Vitals Group     BP 09/29/19 0137 (!) 166/92     Pulse Rate 09/29/19 0137 87     Resp 09/29/19 0137 20     Temp 09/29/19 0137 98.2 F (36.8 C)     Temp Source 09/29/19 0137 Oral     SpO2 09/29/19 0137 97 %     Weight 09/29/19 0138 195 lb (88.5 kg)     Height 09/29/19 0138 5\' 8"  (1.727 m)     Head Circumference --      Peak Flow --      Pain Score 09/29/19 0138 5   Constitutional: Alert and oriented.  Eyes: Bilateral  injected conjunctiva. No significant uptake of fluorescein.  ENT      Head: Normocephalic and atraumatic.      Nose: No congestion/rhinnorhea.      Mouth/Throat: Mucous membranes are moist.      Neck: No stridor. Hematological/Lymphatic/Immunilogical: No cervical lymphadenopathy. Cardiovascular: Normal rate, regular rhythm.  No murmurs, rubs, or gallops.  Respiratory: Normal respiratory effort without tachypnea nor retractions. Breath sounds are clear and equal bilaterally. No wheezes/rales/rhonchi. Gastrointestinal: Soft and non tender. No rebound. No guarding.  Genitourinary: Deferred Musculoskeletal: Normal range of motion in all extremities.  Neurologic:  Normal speech and language. No gross focal neurologic deficits are appreciated.  Skin:  Skin is warm, dry  and intact. No rash noted. Psychiatric: Mood and affect are normal. Speech and behavior are normal. Patient exhibits appropriate insight and judgment.  ____________________________________________    LABS (pertinent positives/negatives)  None  ____________________________________________   EKG  None  ____________________________________________    RADIOLOGY  None  ____________________________________________   PROCEDURES  Procedures  ____________________________________________   INITIAL IMPRESSION / ASSESSMENT AND PLAN / ED COURSE  Pertinent labs & imaging results that were available during my care of the patient were reviewed by me and considered in my medical decision making (see chart for details).   Patient presented to the emergency department today because of concerns for bilateral eye pain.  Patient did have exposure to welding torch light earlier today.  I do think he likely has photokeratitis.  Did fluorescein stain which did not show any uptake in the cornea.  Will give patient prescription for pain medication and eyedrops. Unfortunately did discuss with patient concern for corneal ulcer or  worsening condition with repeated tetracaine use.  Discussed with patient importance of ophthalmology follow-up. ____________________________________________   FINAL CLINICAL IMPRESSION(S) / ED DIAGNOSES  Final diagnoses:  Photokeratitis of both eyes     Note: This dictation was prepared with Dragon dictation. Any transcriptional errors that result from this process are unintentional     Nance Pear, MD 09/29/19 [phone]

## 2019-09-29 NOTE — ED Triage Notes (Signed)
Patient to ED after waking up this morning with severe eye pain. Was in a garage where somebody was welding yesterday, stated he would look at it every once in a while. Woke up tonight with severe pain. Eyes watering and patient cant keep eyes open

## 2019-09-29 NOTE — ED Notes (Signed)
Family at room prior to pt arrival, pt assisted to bed when arrived and given ice pack for eyes with the wash cloth

## 2019-09-29 NOTE — Discharge Instructions (Addendum)
Please seek medical attention for any high fevers, chest pain, shortness of breath, change in behavior, persistent vomiting, bloody stool or any other new or concerning symptoms.  

## 2019-09-29 NOTE — ED Notes (Signed)
No peripheral IV placed this visit.    Discharge instructions reviewed with patient. Questions fielded by this RN. Patient verbalizes understanding of instructions. Patient discharged home in stable condition per goodman. No acute distress noted at time of discharge.    

## 2019-10-10 ENCOUNTER — Ambulatory Visit (INDEPENDENT_AMBULATORY_CARE_PROVIDER_SITE_OTHER): Payer: Medicare Other | Admitting: Urology

## 2019-10-10 ENCOUNTER — Other Ambulatory Visit: Payer: Self-pay

## 2019-10-10 ENCOUNTER — Encounter: Payer: Self-pay | Admitting: Urology

## 2019-10-10 VITALS — BP 130/80 | HR 80 | Ht 68.0 in | Wt 194.0 lb

## 2019-10-10 DIAGNOSIS — E291 Testicular hypofunction: Secondary | ICD-10-CM

## 2019-10-10 NOTE — Progress Notes (Signed)
10/10/2019 11:07 AM   Andrew Mccarthy [birth year] [id number]  Referring provider: Delsa Grana, PA-C [address] [place],  [place] [address]  Chief Complaint  Patient presents with  . Low Testosterone    New Patient    HPI: Andrew Mccarthy is a 68 y.o. male seen at the request of Delsa Grana, PA-C for evaluation of hypogonadism.  He was seen by his PCP 08/09/2019 complaining of increased fatigue.  A testosterone level was drawn which was low at 245 ng/dL.  He has a history of hypogonadism and has previously seen Dr. Yves Dill.  He was treated with testosterone pellet implantation on 2 occasions then decided to discontinue and not seek alternative options.  He does relate to increased tiredness and fatigue however today he states he is retired and his symptoms are not that bothersome.  He denies decreased libido however states he is rarely sexually active secondary to health issues with his wife.  He denies bothersome lower urinary tract symptoms.  Past urologic history remarkable for stone disease.   PMH: Past Medical History:  Diagnosis Date  . Abdominal aortic ectasia ([place]) 08/15/2018  . COPD (chronic obstructive pulmonary disease) ([place])   . ED (erectile dysfunction)   . 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 History of gastritis    not currently an issue  . 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 History of kidney stones last 2013  . Hyperlipidemia   . Hypogonadism male   . Smoker   . Vitamin D deficiency     Surgical History: Past Surgical History:  Procedure Laterality Date  . CARDIOVASCULAR STRESS TEST  2010   myoview, WNL  . COLONOSCOPY  09/2007   WNL, rpt 10 yrs [name]) [place]  . COLONOSCOPY WITH PROPOFOL N/A 01/10/2018   Procedure: COLONOSCOPY WITH PROPOFOL;  Surgeon: Toledo, [name], MD;  Location: [facility] ENDOSCOPY;  Service: Gastroenterology;  Laterality: N/A;  . ESOPHAGOGASTRODUODENOSCOPY  09/2007   WNL,   . Kidney Stone Retraction  1993  . LITHOTRIPSY  01/2012   . PILONIDAL CYST EXCISION  2003    Home Medications:  Allergies as of 10/10/2019      Reactions   Chantix [varenicline] Rash      Medication List       Accurate as of October 10, 2019 11:07 AM. If you have any questions, ask your nurse or doctor.        STOP taking these medications   Advair Diskus 250-50 MCG/DOSE Aepb Generic drug: Fluticasone-Salmeterol Stopped by: Abbie Sons, MD   albuterol 108 (90 Base) MCG/ACT inhaler Commonly known as: VENTOLIN HFA Stopped by: Abbie Sons, MD   aspirin 81 MG tablet Stopped by: Abbie Sons, MD   cholecalciferol 1000 units tablet Commonly known as: VITAMIN D Stopped by: Abbie Sons, MD   fluticasone 50 MCG/ACT nasal spray Commonly known as: FLONASE Stopped by: Abbie Sons, MD   oxyCODONE-acetaminophen 5-325 MG tablet Commonly known as: Percocet Stopped by: Abbie Sons, MD   rosuvastatin 10 MG tablet Commonly known as: CRESTOR Stopped by: Abbie Sons, MD   traZODone 50 MG tablet Commonly known as: DESYREL Stopped by: Abbie Sons, MD   vitamin B-12 500 MCG tablet Commonly known as: CYANOCOBALAMIN Stopped by: Abbie Sons, MD   Vitamin D (Ergocalciferol) 1.25 MG (50000 UT) Caps capsule Commonly known as: DRISDOL Stopped by: Abbie Sons, MD       Allergies:  Allergies  Allergen Reactions  . Chantix [Varenicline] Rash    Family History: Family History  Problem Relation Age of Onset  . Cancer Father 29       prostate  . Cancer Sister        breast  . Rheum arthritis Sister   . Alzheimer's disease Mother   . Cancer Son 71       colon  . Coronary artery disease Neg Hx   . Stroke Neg Hx   . Diabetes Neg Hx     Social History:  reports that he has quit smoking. His smoking use included cigarettes. He started smoking about 2 years ago. He has a 70.00 pack-year smoking history. He has never used smokeless tobacco. He reports that he does not drink alcohol or use drugs.   ROS: UROLOGY Frequent Urination?: No Hard to postpone urination?: No Burning/pain with urination?: No Get up at night to urinate?: No Leakage of urine?: No Urine stream starts and stops?: No Trouble starting stream?: No Do you have to strain to urinate?: No Blood in urine?: No Urinary tract infection?: No Sexually transmitted disease?: No Injury to kidneys or bladder?: No Painful intercourse?: No Weak stream?: No Erection problems?: No Penile pain?: No  Gastrointestinal Nausea?: No Vomiting?: No Indigestion/heartburn?: No Diarrhea?: No Constipation?: No  Constitutional Fever: No Night sweats?: No Weight loss?: No Fatigue?: No  Skin Skin rash/lesions?: No Itching?: No  Eyes Blurred vision?: No Double vision?: No  Ears/Nose/Throat Sore throat?: No Sinus problems?: No  Hematologic/Lymphatic Swollen glands?: No Easy bruising?: No  Cardiovascular Leg swelling?: No Chest pain?: No  Respiratory Cough?: No Shortness of breath?: No  Endocrine Excessive thirst?: No  Musculoskeletal Back pain?: No Joint pain?: No  Neurological Headaches?: No Dizziness?: No  Psychologic Depression?: No Anxiety?: No  Physical Exam: BP 130/80   Pulse 80   Ht 5\' 8"  (1.727 m)   Wt 194 lb (88 kg)   BMI 29.50 kg/m   Constitutional:  Alert and oriented, No acute distress. HEENT: [place] AT, moist mucus membranes.  Trachea midline, no masses. Cardiovascular: No clubbing, cyanosis, or edema. Respiratory: Normal respiratory effort, no increased work of breathing. Skin: No rashes, bruises or suspicious lesions. Neurologic: Grossly intact, no focal deficits, moving all 4 extremities. Psychiatric: Normal mood and affect.   Assessment & Plan:    - Hypogonadism male He states his current symptoms are not that bothersome and was inquiring if there is a pill that he could take.  He is not interested in topical or injectable testosterone.  He was informed that oral testosterone  was recently approved however insurance coverage is presently not widespread.  We did discuss the off label use of Clomid and he was interested in pursuing this option.  LH was drawn and if mid range to low normal he would like a trial of Clomid.  If he is a candidate for Clomid will see him back in 6-8 weeks for a testosterone level and symptom reassessment.  Testosterone and serum prolactin was also ordered.   Abbie Sons, [place] [address] [place],  [address] [phone]

## 2019-10-11 ENCOUNTER — Telehealth (INDEPENDENT_AMBULATORY_CARE_PROVIDER_SITE_OTHER): Payer: Medicare Other | Admitting: Urology

## 2019-10-11 ENCOUNTER — Telehealth: Payer: Self-pay

## 2019-10-11 LAB — LUTEINIZING HORMONE: LH: 6.1 m[IU]/mL (ref 1.7–8.6)

## 2019-10-11 LAB — TESTOSTERONE: Testosterone: 222 ng/dL — ABNORMAL LOW (ref 264–916)

## 2019-10-11 LAB — PROLACTIN: Prolactin: 12.6 ng/mL (ref 4.0–15.2)

## 2019-10-11 MED ORDER — CLOMIPHENE CITRATE 50 MG PO TABS: 25.0000 mg | ORAL_TABLET | Freq: Every day | ORAL | 0 refills | Status: DC

## 2019-10-11 NOTE — Telephone Encounter (Signed)
error 

## 2019-10-11 NOTE — Telephone Encounter (Signed)
Rx sent. Pt informed.  

## 2019-10-11 NOTE — Addendum Note (Signed)
Addended by: John Giovanni C on: 10/11/2019 01:12 PM   Modules accepted: Level of Service

## 2019-10-11 NOTE — Telephone Encounter (Signed)
-----   Message from Abbie Sons, MD sent at 10/11/2019  8:34 AM EST ----- LH was within normal range.  He would be a candidate for oral Clomid if desired.

## 2019-10-11 NOTE — Telephone Encounter (Signed)
Called pt, informed him of the information below. Pt gave verbal understanding, would like to proceed with Clomid.

## 2019-11-20 ENCOUNTER — Other Ambulatory Visit: Payer: Medicare Other

## 2019-11-22 ENCOUNTER — Telehealth: Payer: Medicare Other | Admitting: Urology

## 2019-12-03 ENCOUNTER — Telehealth: Payer: Self-pay

## 2019-12-03 NOTE — Telephone Encounter (Signed)
Patient has medicare and fatigue is not for the iron TIBC and ferritin that you did.  Can you try another code based on medical necessity.

## 2019-12-03 NOTE — Telephone Encounter (Signed)
Copied from [place] [phone]. Topic: General - Inquiry >> Nov 12, 2019  1:57 PM Richardo Priest, [place] wrote: Reason for CRM: Pt's wife called in stating she would like to speak with someone in regards to bill pt got for lab work 10/20. Pt is being being over 200 dollars for the iron, TIBC, and ferritin. PT's wife received bill from [facility] saying it is not being covered by their medicare. Pt states they cannot keep paying these types of bills as they are now feeling that if this is the case they do not want any labwork in the future. Pt is wondering if there is anyway for the coding to change the bill, as medicare is saying this test was not even necessary, even though it was ordered due to pt having fatigue. Pt is irate because they are claiming that pt signed paperwork was stating pt will cover bill, and pt stated he never signed this he only signed stating the sheet identifying himself. Please advise to see what can be done. >> Dec 02, 2019 12:24 PM Greggory Keen D wrote: Pts wife called in asking if anything has been done regarding the labs he was charged for on 08/09/2019.  He is being billed for labs that the ins denied

## 2019-12-03 NOTE — Telephone Encounter (Signed)
Copied from [place] [id number]. Topic: General - Inquiry >> Nov 12, 2019  1:57 PM Richardo Priest, [place] wrote: Reason for CRM: Pt's wife called in stating she would like to speak with someone in regards to bill pt got for lab work 10/20. Pt is being being over 200 dollars for the iron, TIBC, and ferritin. PT's wife received bill from [facility] saying it is not being covered by their medicare. Pt states they cannot keep paying these types of bills as they are now feeling that if this is the case they do not want any labwork in the future. Pt is wondering if there is anyway for the coding to change the bill, as medicare is saying this test was not even necessary, even though it was ordered due to pt having fatigue. Pt is irate because they are claiming that pt signed paperwork was stating pt will cover bill, and pt stated he never signed this he only signed stating the sheet identifying himself. Please advise to see what can be done. >> Dec 02, 2019 12:24 PM Greggory Keen D wrote: Pts wife called in asking if anything has been done regarding the labs he was charged for on 08/09/2019.  He is being billed for labs that the ins denied

## 2019-12-04 NOTE — Telephone Encounter (Signed)
Spoke with our on site [employer] member, Andrew Mccarthy, to see if the following test, iron, TIBC, and ferritin could be re-filed using icd 10 code R20.2 (paresthesias) and E29.1 (hypogonadism)per provider Delsa Grana.  Andrew Mccarthy was able to speak with a [facility] billing representative and was informed that that test would be re-filed to patient's insurance, Medicare, using the above diagnosis. Please allow 30 -45 days for reprocessing.  Spoke with patient's wife to give the above information and she verbalized understanding.

## 2020-02-07 ENCOUNTER — Ambulatory Visit: Payer: Medicare Other | Admitting: Family Medicine

## 2020-02-10 ENCOUNTER — Telehealth: Payer: Self-pay

## 2020-02-10 NOTE — Telephone Encounter (Signed)
Pt wife left message on  vm regarding medication. SW patient, pt called pharmacy and received information he was looking for.

## 2020-03-09 ENCOUNTER — Encounter: Payer: Self-pay | Admitting: Urology

## 2020-03-09 ENCOUNTER — Telehealth: Payer: Self-pay | Admitting: Radiology

## 2020-03-09 ENCOUNTER — Telehealth (INDEPENDENT_AMBULATORY_CARE_PROVIDER_SITE_OTHER): Payer: Medicare Other | Admitting: Urology

## 2020-03-09 ENCOUNTER — Other Ambulatory Visit: Payer: Self-pay

## 2020-03-09 DIAGNOSIS — E291 Testicular hypofunction: Secondary | ICD-10-CM

## 2020-03-09 NOTE — Progress Notes (Signed)
Virtual Visit via Telephone Note  I connected with [name] on 03/09/20 at 10:15 AM EDT by telephone and verified that I am speaking with the correct person using two identifiers.  Location: Patient: [facility] Provider: [facility]   I discussed the limitations, risks, security and privacy concerns of performing an evaluation and management service by telephone and the availability of in person appointments. I also discussed with the patient that there may be a patient responsible charge related to this service. The patient expressed understanding and agreed to proceed.   History of Present Illness: 69 y.o. male initially seen 10/10/2019 for tiredness and fatigue.  Had previously had testosterone pellet implantation on 2 occasions by Dr. Yves Dill.  Testosterone levels were 245 and 222.  LH was not elevated and he elected Clomid.  He does note some improvement in his energy level however thinks one reason he may be tiredness because he does not sleep well.   Observations/Objective: N/A  Assessment and Plan: Hypogonadism on Clomid.  He has not had a repeat testosterone level since starting therapy.  Follow Up Instructions: Schedule follow-up lab visit for testosterone level.   I discussed the assessment and treatment plan with the patient. The patient was provided an opportunity to ask questions and all were answered. The patient agreed with the plan and demonstrated an understanding of the instructions.   The patient was advised to call back or seek an in-person evaluation if the symptoms worsen or if the condition fails to improve as anticipated.  I provided 12 minutes of non-face-to-face time during this encounter.   Abbie Sons, MD

## 2020-03-09 NOTE — Telephone Encounter (Signed)
-----   Message from Abbie Sons, MD sent at 03/09/2020 10:28 AM EDT ----- Regarding: Lab visit Please schedule lab visit for testosterone level.  Contact patient via wife phone [phone]

## 2020-03-09 NOTE — Telephone Encounter (Signed)
LMOM to call back to schedule lab appointment.

## 2020-03-13 ENCOUNTER — Other Ambulatory Visit: Payer: Self-pay | Admitting: Family Medicine

## 2020-03-13 DIAGNOSIS — E291 Testicular hypofunction: Secondary | ICD-10-CM

## 2020-03-16 ENCOUNTER — Other Ambulatory Visit: Payer: Medicare Other

## 2020-03-16 ENCOUNTER — Other Ambulatory Visit: Payer: Self-pay

## 2020-03-16 DIAGNOSIS — E291 Testicular hypofunction: Secondary | ICD-10-CM

## 2020-03-17 ENCOUNTER — Telehealth: Payer: Self-pay | Admitting: *Deleted

## 2020-03-17 LAB — TESTOSTERONE: Testosterone: 448 ng/dL (ref 264–916)

## 2020-03-17 NOTE — Telephone Encounter (Signed)
-----   Message from Abbie Sons, MD sent at 03/17/2020  7:18 AM EDT ----- Testosterone level normal 448.  Recommend 6 months office follow-up with PSA, testosterone, hematocrit prior.

## 2020-03-17 NOTE — Telephone Encounter (Signed)
Notified patient as instructed, patient pleased. Discussed follow-up appointments, patient agrees  

## 2020-04-16 ENCOUNTER — Other Ambulatory Visit: Payer: Self-pay | Admitting: Sports Medicine

## 2020-04-16 DIAGNOSIS — G8929 Other chronic pain: Secondary | ICD-10-CM

## 2020-04-16 DIAGNOSIS — M47816 Spondylosis without myelopathy or radiculopathy, lumbar region: Secondary | ICD-10-CM

## 2020-04-16 DIAGNOSIS — M5137 Other intervertebral disc degeneration, lumbosacral region: Secondary | ICD-10-CM

## 2020-04-16 DIAGNOSIS — M62830 Muscle spasm of back: Secondary | ICD-10-CM

## 2020-04-28 ENCOUNTER — Ambulatory Visit
Admission: RE | Admit: 2020-04-28 | Discharge: 2020-04-28 | Disposition: A | Discharge status: Home / Self Care | Payer: Medicare Other | Source: Ambulatory Visit | Attending: Sports Medicine | Admitting: Sports Medicine

## 2020-04-28 ENCOUNTER — Other Ambulatory Visit: Payer: Self-pay

## 2020-04-28 DIAGNOSIS — G8929 Other chronic pain: Secondary | ICD-10-CM

## 2020-04-28 DIAGNOSIS — M545 Low back pain, unspecified: Secondary | ICD-10-CM

## 2020-04-28 DIAGNOSIS — M47816 Spondylosis without myelopathy or radiculopathy, lumbar region: Secondary | ICD-10-CM | POA: Diagnosis present

## 2020-04-28 DIAGNOSIS — M5137 Other intervertebral disc degeneration, lumbosacral region: Secondary | ICD-10-CM | POA: Diagnosis present

## 2020-04-28 DIAGNOSIS — M62830 Muscle spasm of back: Secondary | ICD-10-CM | POA: Insufficient documentation

## 2020-06-23 ENCOUNTER — Telehealth: Payer: Self-pay | Admitting: *Deleted

## 2020-06-23 NOTE — Telephone Encounter (Signed)
(  06/23/2020) Pt notified that lung cancer screening imaging is due currently or in the near future. Verified smoking history (Former Smoker since 2018, 2 ppd). Tentative appt for 07/10/20 @ 10:00 am. SRW

## 2020-06-24 ENCOUNTER — Other Ambulatory Visit: Payer: Self-pay | Admitting: *Deleted

## 2020-06-24 DIAGNOSIS — Z87891 Personal history of nicotine dependence: Secondary | ICD-10-CM

## 2020-06-24 DIAGNOSIS — Z122 Encounter for screening for malignant neoplasm of respiratory organs: Secondary | ICD-10-CM

## 2020-07-10 ENCOUNTER — Other Ambulatory Visit: Payer: Self-pay

## 2020-07-10 ENCOUNTER — Ambulatory Visit
Admission: RE | Admit: 2020-07-10 | Discharge: 2020-07-10 | Disposition: A | Discharge status: Home / Self Care | Payer: Medicare Other | Source: Ambulatory Visit | Attending: Oncology | Admitting: Oncology

## 2020-07-10 DIAGNOSIS — Z87891 Personal history of nicotine dependence: Secondary | ICD-10-CM | POA: Diagnosis not present

## 2020-07-10 DIAGNOSIS — Z122 Encounter for screening for malignant neoplasm of respiratory organs: Secondary | ICD-10-CM

## 2020-07-16 ENCOUNTER — Encounter: Payer: Self-pay | Admitting: *Deleted

## 2020-09-10 ENCOUNTER — Other Ambulatory Visit: Payer: Self-pay

## 2020-09-10 DIAGNOSIS — E291 Testicular hypofunction: Secondary | ICD-10-CM

## 2020-09-14 ENCOUNTER — Other Ambulatory Visit: Payer: Self-pay

## 2020-09-15 ENCOUNTER — Other Ambulatory Visit: Payer: Self-pay

## 2020-09-15 ENCOUNTER — Other Ambulatory Visit: Payer: Medicare Other

## 2020-09-15 DIAGNOSIS — E291 Testicular hypofunction: Secondary | ICD-10-CM

## 2020-09-16 LAB — HEMATOCRIT: Hematocrit: 45.3 % (ref 37.5–51.0)

## 2020-09-16 LAB — TESTOSTERONE: Testosterone: 173 ng/dL — ABNORMAL LOW (ref 264–916)

## 2020-09-16 LAB — PSA: Prostate Specific Ag, Serum: 0.9 ng/mL (ref 0.0–4.0)

## 2020-09-17 ENCOUNTER — Encounter: Payer: Self-pay | Admitting: Urology

## 2020-09-17 ENCOUNTER — Other Ambulatory Visit: Payer: Self-pay

## 2020-09-17 ENCOUNTER — Ambulatory Visit (INDEPENDENT_AMBULATORY_CARE_PROVIDER_SITE_OTHER): Payer: Medicare Other | Admitting: Urology

## 2020-09-17 VITALS — BP 143/83 | HR 73 | Ht 69.0 in | Wt 199.0 lb

## 2020-09-17 DIAGNOSIS — E291 Testicular hypofunction: Secondary | ICD-10-CM

## 2020-09-17 MED ORDER — CLOMIPHENE CITRATE 50 MG PO TABS: 25.0000 mg | ORAL_TABLET | Freq: Every day | ORAL | 0 refills | Status: AC

## 2020-09-17 NOTE — Progress Notes (Signed)
09/17/2020 9:16 AM   Andrew Mccarthy 09-Mar-1951 [id number]  Referring provider: Delsa Grana, PA-C [address] [place],  [place] [address]  Chief Complaint  Patient presents with   Hypogonadism    HPI: 69 y.o. male presents for follow-up of hypogonadism.   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   History of kidney stones last 2013   Hyperlipidemia    Hypogonadism male    Smoker    Vitamin D deficiency     Surgical History: Past Surgical History:  Procedure Laterality Date   CARDIOVASCULAR STRESS TEST  2010   myoview, WNL   COLONOSCOPY  09/2007   WNL, rpt 10 yrs Andrew Mccarthy) [facility]   COLONOSCOPY WITH PROPOFOL N/A 01/10/2018   Procedure: COLONOSCOPY WITH PROPOFOL;  Surgeon: Toledo, [name], MD;  Location: [facility] ENDOSCOPY;  Service: Gastroenterology;  Laterality: N/A;   ESOPHAGOGASTRODUODENOSCOPY  09/2007   WNL,    Kidney Stone Retraction  1993   LITHOTRIPSY  01/2012   PILONIDAL CYST EXCISION  2003    Home Medications:  Allergies as of 09/17/2020      Reactions   Chantix [varenicline] Rash      Medication List       Accurate as of September 17, 2020  9:16 AM. If you have any questions, ask your nurse or doctor.        STOP taking these medications   clomiPHENE 50 MG tablet Commonly known as: CLOMID Stopped by: Abbie Sons, MD     TAKE these medications   Cholecalciferol 25 MCG (1000 UT)  tablet Take by mouth.       Allergies:  Allergies  Allergen Reactions   Chantix [Varenicline] Rash    Family History: Family History  Problem Relation Age of Onset   Cancer Father 54       prostate   Cancer Sister        breast   Rheum arthritis Sister    Alzheimer's disease Mother    Cancer Son 34       colon   Coronary artery disease Neg Hx    Stroke Neg Hx    Diabetes Neg Hx     Social History:  reports that he has quit smoking. His smoking use included cigarettes. He started smoking about 3 years ago. He has a 70.00 pack-year smoking history. He has never used smokeless tobacco. He reports that he does not drink alcohol and does not use drugs.   Physical Exam: BP (!) 143/83    Pulse 73    Ht 5\' 9"  (1.753 m)    Wt 199 lb (90.3 kg)    BMI 29.39 kg/m   Constitutional:  Alert and oriented, No acute distress. HEENT: [place] AT, moist mucus membranes.  Trachea midline, no masses. Cardiovascular: No clubbing, cyanosis, or edema. Respiratory: Normal respiratory effort, no increased work of breathing.    Assessment & Plan:  1.  Hypogonadism  Levels on Clomid were adequate but no symptom improvement; he only took for 2 months  We discussed other options of testosterone injections and topical gels.  He was on pellet implantation with Dr. Yves Dill and did not like this method of delivery  He indicated he would like to try Clomid 1 more time and reassess his response  Rx sent and will obtain a follow-up testosterone level in 4-6 weeks.   Abbie Sons, [place] [address], [place], [place] [address] [phone]

## 2020-09-24 ENCOUNTER — Ambulatory Visit: Payer: Medicare Other

## 2020-10-26 ENCOUNTER — Other Ambulatory Visit: Payer: Self-pay | Admitting: Family Medicine

## 2020-10-26 DIAGNOSIS — E291 Testicular hypofunction: Secondary | ICD-10-CM

## 2020-10-27 ENCOUNTER — Other Ambulatory Visit: Payer: Medicare Other

## 2020-10-27 ENCOUNTER — Encounter: Payer: Self-pay | Admitting: Urology

## 2020-12-01 DIAGNOSIS — U071 COVID-19: Secondary | ICD-10-CM | POA: Diagnosis not present

## 2020-12-01 DIAGNOSIS — J208 Acute bronchitis due to other specified organisms: Secondary | ICD-10-CM | POA: Diagnosis not present

## 2020-12-01 DIAGNOSIS — R059 Cough, unspecified: Secondary | ICD-10-CM | POA: Diagnosis not present

## 2020-12-01 DIAGNOSIS — J209 Acute bronchitis, unspecified: Secondary | ICD-10-CM | POA: Diagnosis not present

## 2020-12-04 DIAGNOSIS — J1282 Pneumonia due to coronavirus disease 2019: Secondary | ICD-10-CM | POA: Diagnosis not present

## 2020-12-04 DIAGNOSIS — U071 COVID-19: Secondary | ICD-10-CM | POA: Diagnosis not present

## 2020-12-18 DIAGNOSIS — J1282 Pneumonia due to coronavirus disease 2019: Secondary | ICD-10-CM | POA: Diagnosis not present

## 2020-12-18 DIAGNOSIS — J189 Pneumonia, unspecified organism: Secondary | ICD-10-CM | POA: Diagnosis not present

## 2020-12-18 DIAGNOSIS — U071 COVID-19: Secondary | ICD-10-CM | POA: Diagnosis not present

## 2021-01-01 DIAGNOSIS — J431 Panlobular emphysema: Secondary | ICD-10-CM | POA: Diagnosis not present

## 2021-01-01 DIAGNOSIS — U071 COVID-19: Secondary | ICD-10-CM | POA: Diagnosis not present

## 2021-01-01 DIAGNOSIS — J1282 Pneumonia due to coronavirus disease 2019: Secondary | ICD-10-CM | POA: Diagnosis not present

## 2021-01-21 DIAGNOSIS — J189 Pneumonia, unspecified organism: Secondary | ICD-10-CM | POA: Diagnosis not present

## 2021-01-21 DIAGNOSIS — U071 COVID-19: Secondary | ICD-10-CM | POA: Diagnosis not present

## 2021-01-21 DIAGNOSIS — J431 Panlobular emphysema: Secondary | ICD-10-CM | POA: Diagnosis not present

## 2021-01-21 DIAGNOSIS — I700 Atherosclerosis of aorta: Secondary | ICD-10-CM | POA: Diagnosis not present

## 2021-01-21 DIAGNOSIS — J1282 Pneumonia due to coronavirus disease 2019: Secondary | ICD-10-CM | POA: Diagnosis not present

## 2021-01-29 ENCOUNTER — Emergency Department: Payer: PPO

## 2021-01-29 ENCOUNTER — Encounter: Payer: Self-pay | Admitting: Emergency Medicine

## 2021-01-29 ENCOUNTER — Other Ambulatory Visit: Payer: Self-pay

## 2021-01-29 ENCOUNTER — Emergency Department
Admission: EM | Admit: 2021-01-29 | Discharge: 2021-01-29 | Disposition: A | Discharge status: Home / Self Care | Payer: PPO | Attending: Emergency Medicine | Admitting: Emergency Medicine

## 2021-01-29 DIAGNOSIS — Z20822 Contact with and (suspected) exposure to covid-19: Secondary | ICD-10-CM | POA: Insufficient documentation

## 2021-01-29 DIAGNOSIS — Z87891 Personal history of nicotine dependence: Secondary | ICD-10-CM | POA: Insufficient documentation

## 2021-01-29 DIAGNOSIS — R0602 Shortness of breath: Secondary | ICD-10-CM | POA: Diagnosis not present

## 2021-01-29 DIAGNOSIS — J439 Emphysema, unspecified: Secondary | ICD-10-CM | POA: Diagnosis not present

## 2021-01-29 DIAGNOSIS — J8410 Pulmonary fibrosis, unspecified: Secondary | ICD-10-CM | POA: Insufficient documentation

## 2021-01-29 DIAGNOSIS — Z8701 Personal history of pneumonia (recurrent): Secondary | ICD-10-CM | POA: Diagnosis not present

## 2021-01-29 DIAGNOSIS — R0600 Dyspnea, unspecified: Secondary | ICD-10-CM

## 2021-01-29 DIAGNOSIS — Z8616 Personal history of COVID-19: Secondary | ICD-10-CM | POA: Insufficient documentation

## 2021-01-29 DIAGNOSIS — R911 Solitary pulmonary nodule: Secondary | ICD-10-CM | POA: Diagnosis not present

## 2021-01-29 LAB — CBC WITH DIFFERENTIAL/PLATELET
Abs Immature Granulocytes: 0.09 10*3/uL — ABNORMAL HIGH (ref 0.00–0.07)
Basophils Absolute: 0.1 10*3/uL (ref 0.0–0.1)
Basophils Relative: 1 %
Eosinophils Absolute: 0.5 10*3/uL (ref 0.0–0.5)
Eosinophils Relative: 5 %
HCT: 41.3 % (ref 39.0–52.0)
Hemoglobin: 14.1 g/dL (ref 13.0–17.0)
Immature Granulocytes: 1 %
Lymphocytes Relative: 16 %
Lymphs Abs: 1.6 10*3/uL (ref 0.7–4.0)
MCH: 30.3 pg (ref 26.0–34.0)
MCHC: 34.1 g/dL (ref 30.0–36.0)
MCV: 88.8 fL (ref 80.0–100.0)
Monocytes Absolute: 0.7 10*3/uL (ref 0.1–1.0)
Monocytes Relative: 7 %
Neutro Abs: 7.1 10*3/uL (ref 1.7–7.7)
Neutrophils Relative %: 70 %
Platelets: 312 10*3/uL (ref 150–400)
RBC: 4.65 MIL/uL (ref 4.22–5.81)
RDW: 13.7 % (ref 11.5–15.5)
WBC: 10.1 10*3/uL (ref 4.0–10.5)
nRBC: 0 % (ref 0.0–0.2)

## 2021-01-29 LAB — COMPREHENSIVE METABOLIC PANEL
ALT: 21 U/L (ref 0–44)
AST: 30 U/L (ref 15–41)
Albumin: 3.4 g/dL — ABNORMAL LOW (ref 3.5–5.0)
Alkaline Phosphatase: 97 U/L (ref 38–126)
Anion gap: 11 (ref 5–15)
BUN: 12 mg/dL (ref 8–23)
CO2: 22 mmol/L (ref 22–32)
Calcium: 8.6 mg/dL — ABNORMAL LOW (ref 8.9–10.3)
Chloride: 101 mmol/L (ref 98–111)
Creatinine, Ser: 1.07 mg/dL (ref 0.61–1.24)
GFR, Estimated: >60 mL/min (ref >60)
Glucose, Bld: 207 mg/dL — ABNORMAL HIGH (ref 70–99)
Potassium: 3.6 mmol/L (ref 3.5–5.1)
Sodium: 134 mmol/L — ABNORMAL LOW (ref 135–145)
Total Bilirubin: 1.1 mg/dL (ref 0.3–1.2)
Total Protein: 7.3 g/dL (ref 6.5–8.1)

## 2021-01-29 LAB — RESP PANEL BY RT-PCR (FLU A&B, COVID) ARPGX2
Influenza A by PCR: NEGATIVE
Influenza B by PCR: NEGATIVE
SARS Coronavirus 2 by RT PCR: NEGATIVE

## 2021-01-29 LAB — LACTIC ACID, PLASMA
Lactic Acid, Venous: 3.7 mmol/L (ref 0.5–1.9)
Lactic Acid, Venous: 4.1 mmol/L (ref 0.5–1.9)

## 2021-01-29 MED ORDER — IPRATROPIUM-ALBUTEROL 0.5-2.5 (3) MG/3ML IN SOLN
3.0000 mL | Freq: Once | RESPIRATORY_TRACT | Status: AC
  Administered 2021-01-29: 3 mL via RESPIRATORY_TRACT
  Filled 2021-01-29: qty 3

## 2021-01-29 MED ORDER — IOHEXOL 350 MG/ML SOLN
100.0000 mL | Freq: Once | INTRAVENOUS | Status: AC | PRN
  Administered 2021-01-29: 100 mL via INTRAVENOUS

## 2021-01-29 MED ORDER — PREDNISONE 10 MG PO TABS: ORAL_TABLET | ORAL | 0 refills | Status: AC

## 2021-01-29 MED ORDER — METHYLPREDNISOLONE SODIUM SUCC 125 MG IJ SOLR
125.0000 mg | Freq: Once | INTRAMUSCULAR | Status: AC
  Administered 2021-01-29: 125 mg via INTRAVENOUS
  Filled 2021-01-29: qty 2

## 2021-01-29 NOTE — ED Provider Notes (Signed)
[facility] Emergency Department Provider Note  Time seen: 6:52 PM  I have reviewed the triage vital signs and the nursing notes.   HISTORY  Chief Complaint Shortness of Breath   HPI Andrew Mccarthy is a 70 y.o. male with a past medical history of COPD, hyperlipidemia, presents to the emergency department for worsening shortness of breath and dyspnea on exertion.  According to report and record review as well as patient history 8 weeks ago he was diagnosed with COVID pneumonia.  Patient was put on treatments including breathing treatments at home.  Patient states he was improving in 3 weeks ago felt nearly back to normal.  Patient states over the past 2 to 3 weeks he has had progressively worsening shortness of breath and dyspnea on exertion.  Patient saw his doctor who recommended he come to the emergency department for further evaluation.  Patient denies any chest pain no pleuritic pain.  No leg swelling.   Past Medical History:  Diagnosis Date  . Abdominal aortic ectasia ([place]) 08/15/2018  . COPD (chronic obstructive pulmonary disease) ([place])   . ED (erectile dysfunction)   . 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 History of gastritis    not currently an issue  . 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 History of kidney stones last 2013  . Hyperlipidemia   . Hypogonadism male   . Smoker   . Vitamin D deficiency     Patient Active Problem List   Diagnosis Date Noted  . Abdominal aortic ectasia ([place]) 08/15/2018  . Anxiety state 07/25/2018  . Welcome to Medicare preventive visit 07/19/2018  . Coronary artery disease 07/19/2018  . Obesity (BMI 30.0-34.9) 07/19/2018  . Coronary artery calcification 07/15/2018  . Elevated alkaline phosphatase level 07/15/2018  . History of smoking 30 or more pack years 03/19/2018  . Aortic atherosclerosis ([place]) 06/28/2016  . Hyperglycemia 06/28/2016  . Obesity 03/10/2016  . Hypertriglyceridemia 01/19/2016  . Sinus bradycardia on  ECG 12/31/2015  . 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 COPD (chronic obstructive pulmonary disease) ([place]) 12/09/2015  . Atypical chest pain 07/04/2014  . Vitamin D deficiency   . History of kidney stones   . Hypogonadism male   . ED (erectile dysfunction)     Past Surgical History:  Procedure Laterality Date  . CARDIOVASCULAR STRESS TEST  2010   myoview, WNL  . COLONOSCOPY  09/2007   WNL, rpt 10 yrs [name]) [facility]  . COLONOSCOPY WITH PROPOFOL N/A 01/10/2018   Procedure: COLONOSCOPY WITH PROPOFOL;  Surgeon: Toledo, [name], MD;  Location: [facility] ENDOSCOPY;  Service: Gastroenterology;  Laterality: N/A;  . ESOPHAGOGASTRODUODENOSCOPY  09/2007   WNL,   . Kidney Stone Retraction  1993  . LITHOTRIPSY  01/2012  . PILONIDAL CYST EXCISION  2003    Prior to Admission medications   Medication Sig Start Date End Date Taking? Authorizing Provider  Cholecalciferol 25 MCG (1000 UT) tablet Take by mouth.    [provider]  clomiPHENE (CLOMID) 50 MG tablet Take 0.5 tablets (25 mg total) by mouth daily. 09/17/20   Abbie Sons, MD    Allergies  Allergen Reactions  . Chantix [Varenicline] Rash    Family History  Problem Relation Age of Onset  . Cancer Father 29       prostate  . Cancer Sister        breast  . Rheum arthritis Sister   . Alzheimer's disease Mother   . Cancer Son  34       colon  . Coronary artery disease Neg Hx   . Stroke Neg Hx   . Diabetes Neg Hx     Social History Social History   Tobacco Use  . Smoking status: Former Smoker    Packs/day: 2.00    Years: 35.00    Pack years: 70.00    Types: Cigarettes    Start date: 07/16/2017  . Smokeless tobacco: Never Used  Vaping Use  . Vaping Use: Never used  Substance Use Topics  . Alcohol use: No  . Drug use: No    Review of Systems Constitutional: Negative for fever. Cardiovascular: Negative for chest pain. Respiratory: Positive for shortness of breath, worse with exertion. Gastrointestinal: Negative  for abdominal pain, vomiting  Musculoskeletal: Negative for musculoskeletal complaints Neurological: Negative for headache All other ROS negative  ____________________________________________   PHYSICAL EXAM:  VITAL SIGNS: ED Triage Vitals  Enc Vitals Group     BP 01/29/21 1818 108/76     Pulse Rate 01/29/21 1818 93     Resp 01/29/21 1818 20     Temp 01/29/21 1818 98.6 F (37 C)     Temp Source 01/29/21 1818 Oral     SpO2 01/29/21 1818 93 %     Weight 01/29/21 1814 198 lb 6.6 oz (90 kg)     Height 01/29/21 1814 5\' 9"  (1.753 m)     Head Circumference --      Peak Flow --      Pain Score 01/29/21 1814 0     Pain Loc --      Pain Edu? --      Excl. in [place]? --     Constitutional: Alert and oriented. Well appearing and in no distress. Eyes: Normal exam ENT      Head: Normocephalic and atraumatic.      Mouth/Throat: Mucous membranes are moist. Cardiovascular: Normal rate, regular rhythm.  Respiratory: Normal respiratory effort without tachypnea nor retractions. Breath sounds are clear Gastrointestinal: Soft and nontender. No distention.   Musculoskeletal: Nontender with normal range of motion in all extremities. No lower extremity tenderness or edema. Neurologic:  Normal speech and language. No gross focal neurologic deficits Skin:  Skin is warm, dry and intact.  Psychiatric: Mood and affect are normal.   ____________________________________________    EKG  EKG viewed and interpreted by myself shows a normal sinus rhythm 80 bpm with a narrow QRS, normal axis, normal intervals, nonspecific with no concerning ST changes.  ____________________________________________    RADIOLOGY  Chest x-ray shows increased interstitial infiltrates which could be atypical infection, chronic lung disease fibrosis.  ____________________________________________   INITIAL IMPRESSION / ASSESSMENT AND PLAN / ED COURSE  Pertinent labs & imaging results that were available during my care  of the patient were reviewed by me and considered in my medical decision making (see chart for details).   Patient presents emergency department for worsening shortness of breath and dyspnea on exertion.  Patient was diagnosed with Covid approximately weeks ago and has been on home treatments including breathing treatments.  Patient was improving significantly per patient however over the past 2 weeks or so has progressively declined and is now more short of breath.  Given the patient's recent Covid positive status now with worsening shortness of breath we will proceed with a CTA of the chest to rule out pulmonary embolism and to better evaluate the patient's lungs given equivocal chest x-ray reading.  Patient's labs are largely within normal  limits.  Troponin is negative.  We will recheck a Covid swab and continue to closely monitor.  Patient's lactic acid has increased slightly to 4.1.  No fluids were given.  CT scan of the chest negative for PE but does show a moderate to advanced emphysema concerning for worsening pulmonary fibrosis and emphysema.  Patient is 8 weeks status post Covid could be exacerbated by recent Covid infection.  Patient is satting 91 to 95% on room air throughout my evaluation.  Given the patient's worsening CT scan and dyspnea especially on exertion I did suggest admission to the hospital for further work-up and treatment.  Patient states he feels well and he strongly wishes to go home.  I do believe is reasonable for trial of home care.  We will dose Solu-Medrol in the emergency department.  We will discharge home with a prednisone taper.  Patient follows up with Dr. Raul Del, we will have the patient follow-up this coming week.  I discussed very strict return precautions.  They have a pulse oximeter at home I discussed returning for any pulse oximeter reading less than 90.  Patient and wife agreeable.  Andrew Mccarthy was evaluated in Emergency Department on 01/29/2021 for the symptoms  described in the history of present illness. He was evaluated in the context of the global COVID-19 pandemic, which necessitated consideration that the patient might be at risk for infection with the SARS-CoV-2 virus that causes COVID-19. Institutional protocols and algorithms that pertain to the evaluation of patients at risk for COVID-19 are in a state of rapid change based on information released by regulatory bodies including the CDC and federal and state organizations. These policies and algorithms were followed during the patient's care in the ED.  ____________________________________________   FINAL CLINICAL IMPRESSION(S) / ED DIAGNOSES  Dyspnea Emphysema Pulmonary fibrosis   Harvest Dark, MD 01/29/21 2110

## 2021-01-29 NOTE — ED Notes (Signed)
Patient transported to CT 

## 2021-01-29 NOTE — ED Triage Notes (Signed)
First Nurse Note:  ARrives with c/o SOB x 1 week.  States recently treated for double pneumonia and had stopped breathing treatments last week.  Did take a breathing treatment today.

## 2021-02-01 DIAGNOSIS — J441 Chronic obstructive pulmonary disease with (acute) exacerbation: Secondary | ICD-10-CM | POA: Diagnosis not present

## 2021-02-02 DIAGNOSIS — R0600 Dyspnea, unspecified: Secondary | ICD-10-CM | POA: Diagnosis not present

## 2021-02-02 DIAGNOSIS — J432 Centrilobular emphysema: Secondary | ICD-10-CM | POA: Diagnosis not present

## 2021-02-22 ENCOUNTER — Other Ambulatory Visit
Admission: RE | Admit: 2021-02-22 | Discharge: 2021-02-22 | Disposition: A | Discharge status: Home / Self Care | Payer: PPO | Source: Ambulatory Visit | Attending: Pulmonary Disease | Admitting: Pulmonary Disease

## 2021-02-22 DIAGNOSIS — U071 COVID-19: Secondary | ICD-10-CM | POA: Insufficient documentation

## 2021-02-22 DIAGNOSIS — R739 Hyperglycemia, unspecified: Secondary | ICD-10-CM | POA: Diagnosis not present

## 2021-02-22 DIAGNOSIS — E782 Mixed hyperlipidemia: Secondary | ICD-10-CM | POA: Diagnosis not present

## 2021-02-22 DIAGNOSIS — Z0000 Encounter for general adult medical examination without abnormal findings: Secondary | ICD-10-CM | POA: Diagnosis not present

## 2021-02-22 DIAGNOSIS — I700 Atherosclerosis of aorta: Secondary | ICD-10-CM | POA: Diagnosis not present

## 2021-02-22 DIAGNOSIS — I77811 Abdominal aortic ectasia: Secondary | ICD-10-CM | POA: Diagnosis not present

## 2021-02-22 DIAGNOSIS — Z125 Encounter for screening for malignant neoplasm of prostate: Secondary | ICD-10-CM | POA: Diagnosis not present

## 2021-02-22 DIAGNOSIS — J208 Acute bronchitis due to other specified organisms: Secondary | ICD-10-CM | POA: Diagnosis not present

## 2021-02-22 DIAGNOSIS — Z79899 Other long term (current) drug therapy: Secondary | ICD-10-CM | POA: Diagnosis not present

## 2021-02-22 DIAGNOSIS — R0600 Dyspnea, unspecified: Secondary | ICD-10-CM | POA: Diagnosis not present

## 2021-02-22 DIAGNOSIS — J431 Panlobular emphysema: Secondary | ICD-10-CM | POA: Diagnosis not present

## 2021-02-22 LAB — D-DIMER, QUANTITATIVE: D-Dimer, Quant: 0.74 ug/mL-FEU — ABNORMAL HIGH (ref 0.00–0.50)

## 2021-03-18 DIAGNOSIS — J431 Panlobular emphysema: Secondary | ICD-10-CM | POA: Diagnosis not present

## 2021-03-18 DIAGNOSIS — Z01818 Encounter for other preprocedural examination: Secondary | ICD-10-CM | POA: Diagnosis not present

## 2021-04-28 ENCOUNTER — Encounter: Payer: Self-pay | Admitting: Dermatology

## 2021-04-28 ENCOUNTER — Other Ambulatory Visit: Payer: Self-pay

## 2021-04-28 ENCOUNTER — Ambulatory Visit: Payer: PPO | Admitting: Dermatology

## 2021-04-28 DIAGNOSIS — L570 Actinic keratosis: Secondary | ICD-10-CM

## 2021-04-28 DIAGNOSIS — C44519 Basal cell carcinoma of skin of other part of trunk: Secondary | ICD-10-CM

## 2021-04-28 DIAGNOSIS — Z85828 Personal history of other malignant neoplasm of skin: Secondary | ICD-10-CM

## 2021-04-28 DIAGNOSIS — C44619 Basal cell carcinoma of skin of left upper limb, including shoulder: Secondary | ICD-10-CM | POA: Diagnosis not present

## 2021-04-28 DIAGNOSIS — L814 Other melanin hyperpigmentation: Secondary | ICD-10-CM | POA: Diagnosis not present

## 2021-04-28 DIAGNOSIS — C44311 Basal cell carcinoma of skin of nose: Secondary | ICD-10-CM | POA: Diagnosis not present

## 2021-04-28 DIAGNOSIS — D485 Neoplasm of uncertain behavior of skin: Secondary | ICD-10-CM

## 2021-04-28 DIAGNOSIS — T148XXA Other injury of unspecified body region, initial encounter: Secondary | ICD-10-CM

## 2021-04-28 DIAGNOSIS — L578 Other skin changes due to chronic exposure to nonionizing radiation: Secondary | ICD-10-CM | POA: Diagnosis not present

## 2021-04-28 DIAGNOSIS — C4491 Basal cell carcinoma of skin, unspecified: Secondary | ICD-10-CM

## 2021-04-28 HISTORY — DX: Basal cell carcinoma of skin, unspecified: C44.91

## 2021-04-28 MED ORDER — MUPIROCIN 2 % EX OINT
1.0000 | TOPICAL_OINTMENT | Freq: Every day | CUTANEOUS | 1 refills | Status: DC
Start: 2021-04-28 — End: 2021-08-04

## 2021-04-28 NOTE — Patient Instructions (Addendum)
Wound Care Instructions  Cleanse wound gently with soap and water once a day then pat dry with clean gauze. Apply a thing coat of Petrolatum (petroleum jelly, "Vaseline") over the wound (unless you have an allergy to this). We recommend that you use a new, sterile tube of Vaseline. Do not pick or remove scabs. Do not remove the yellow or white "healing tissue" from the base of the wound.  Cover the wound with fresh, clean, nonstick gauze and secure with paper tape. You may use Band-Aids in place of gauze and tape if the would is small enough, but would recommend trimming much of the tape off as there is often too much. Sometimes Band-Aids can irritate the skin.  You should call the office for your biopsy report after 1 week if you have not already been contacted.  If you experience any problems, such as abnormal amounts of bleeding, swelling, significant bruising, significant pain, or evidence of infection, please call the office immediately.  FOR ADULT SURGERY PATIENTS: If you need something for pain relief you may take 1 extra strength Tylenol (acetaminophen) AND 2 Ibuprofen (200mg  each) together every 4 hours as needed for pain. (do not take these if you are allergic to them or if you have a reason you should not take them.) Typically, you may only need pain medication for 1 to 3 days.   Cryotherapy Aftercare  Wash gently with soap and water everyday.   Apply Vaseline and Band-Aid daily until healed.   Prior to procedure, discussed risks of blister formation, small wound, skin dyspigmentation, or rare scar following cryotherapy. Recommend Vaseline ointment to treated areas while healing.  Recommend daily broad spectrum sunscreen SPF 30+ to sun-exposed areas, reapply every 2 hours as needed. Call for new or changing lesions.  Staying in the shade or wearing long sleeves, sun glasses (UVA+UVB protection) and wide brim hats (4-inch brim around the entire circumference of the hat) are also  recommended for sun protection.   Recommend taking Heliocare sun protection supplement daily in sunny weather for additional sun protection. For maximum protection on the sunniest days, you can take up to 2 capsules of regular Heliocare OR take 1 capsule of Heliocare Ultra. For prolonged exposure (such as a full day in the sun), you can repeat your dose of the supplement 4 hours after your first dose. Heliocare can be purchased at [facility] or at [email or web address].    If you have any questions or concerns for your doctor, please call our main line at [phone] and press option 4 to reach your doctor's medical assistant. If no one answers, please leave a voicemail as directed and we will return your call as soon as possible. Messages left after 4 pm will be answered the following business day.   You may also send [place] a message via [place]. We typically respond to MyChart messages within 1-2 business days.  For prescription refills, please ask your pharmacy to contact our office. Our fax number is [phone].  If you have an urgent issue when the clinic is closed that cannot wait until the next business day, you can page your doctor at the number below.    Please note that while we do our best to be available for urgent issues outside of office hours, we are not available 24/7.   If you have an urgent issue and are unable to reach [place], you may choose to seek medical care at your doctor's office, retail clinic, urgent care center, or  emergency room.  If you have a medical emergency, please immediately call 911 or go to the emergency department.  Pager Numbers  - Dr. Nehemiah Massed: [phone]  - Dr. Laurence Ferrari: [phone]  - Dr. Nicole Kindred: [phone]  In the event of inclement weather, please call our main line at [phone] for an update on the status of any delays or closures.  Dermatology Medication Tips: Please keep the boxes that topical medications come in in order to help  keep track of the instructions about where and how to use these. Pharmacies typically print the medication instructions only on the boxes and not directly on the medication tubes.   If your medication is too expensive, please contact our office at [phone] option 4 or send [place] a message through [place].   We are unable to tell what your co-pay for medications will be in advance as this is different depending on your insurance coverage. However, we may be able to find a substitute medication at lower cost or fill out paperwork to get insurance to cover a needed medication.   If a prior authorization is required to get your medication covered by your insurance company, please allow [place] 1-2 business days to complete this process.  Drug prices often vary depending on where the prescription is filled and some pharmacies may offer cheaper prices.  The website www.goodrx.com contains coupons for medications through different pharmacies. The prices here do not account for what the cost may be with help from insurance (it may be cheaper with your insurance), but the website can give you the price if you did not use any insurance.  - You can print the associated coupon and take it with your prescription to the pharmacy.  - You may also stop by our office during regular business hours and pick up a GoodRx coupon card.  - If you need your prescription sent electronically to a different pharmacy, notify our office through [facility] or by phone at [phone] option 4.

## 2021-04-28 NOTE — Progress Notes (Addendum)
New Patient Visit  Subjective  Andrew Mccarthy is a 70 y.o. male who presents for the following: Skin Problem (Patient here today for a spot at nasal tip, present for 3-4 months. Also a place at left shoulder that will not heal, present for a few months. He does have a hx of BCC treated by [facility] dermatology. ).   The following portions of the chart were reviewed this encounter and updated as appropriate:   Tobacco  Allergies  Meds  Problems  Med Hx  Surg Hx  Fam Hx       Review of Systems:  No other skin or systemic complaints except as noted in HPI or Assessment and Plan.  Objective  Well appearing patient in no apparent distress; mood and affect are within normal limits.  A focused examination was performed including face, arms, chest. Relevant physical exam findings are noted in the Assessment and Plan.  nasal tip 0.2cm focal ulceration within 0.4cm pink macule R/o BCC     Left Anterior Shoulder 2.6cm eroded pink plaque R/o BCC     Mid Chest 1.1cm pink plaque R/o BCC     Left Forearm Excoriation   Right helix Erythematous thin papules/macules with gritty scale.    Assessment & Plan  Neoplasm of uncertain behavior of skin (3) nasal tip  Skin / nail biopsy Type of biopsy: punch   Informed consent: discussed and consent obtained   Timeout: patient name, date of birth, surgical site, and procedure verified   Patient was prepped and draped in usual sterile fashion: Area prepped with isopropyl alcohol. Anesthesia: the lesion was anesthetized in a standard fashion   Anesthetic:  1% lidocaine w/ epinephrine 1-100,000 buffered w/ 8.4% NaHCO3 Punch size:  2.5 mm Suture size:  6-0 Suture type: nylon   Suture removal (days):  7 Hemostasis achieved with: suture and aluminum chloride   Outcome: patient tolerated procedure well   Post-procedure details: wound care instructions given   Additional details:  Mupirocin and a dressing applied  Specimen 1 -  Surgical pathology Differential Diagnosis: R/o BCC  Check Margins: No 0.2cm focal ulceration within 0.4cm pink macule   Left Anterior Shoulder  Skin / nail biopsy Type of biopsy: tangential   Informed consent: discussed and consent obtained   Timeout: patient name, date of birth, surgical site, and procedure verified   Patient was prepped and draped in usual sterile fashion: Area prepped with isopropyl alcohol. Anesthesia: the lesion was anesthetized in a standard fashion   Anesthetic:  1% lidocaine w/ epinephrine 1-100,000 buffered w/ 8.4% NaHCO3 Instrument used: flexible razor blade   Hemostasis achieved with: aluminum chloride   Outcome: patient tolerated procedure well   Post-procedure details: wound care instructions given   Additional details:  Mupirocin and a bandage applied  Specimen 2 - Surgical pathology Differential Diagnosis: R/o BCC  Check Margins: No 2.6cm eroded pink plaque   Mid Chest  Skin / nail biopsy Type of biopsy: tangential   Informed consent: discussed and consent obtained   Timeout: patient name, date of birth, surgical site, and procedure verified   Patient was prepped and draped in usual sterile fashion: Area prepped with isopropyl alcohol. Anesthesia: the lesion was anesthetized in a standard fashion   Anesthetic:  1% lidocaine w/ epinephrine 1-100,000 buffered w/ 8.4% NaHCO3 Instrument used: flexible razor blade   Hemostasis achieved with: aluminum chloride   Outcome: patient tolerated procedure well   Post-procedure details: wound care instructions given   Additional details:  Mupirocin and a bandage applied  Specimen 3 - Surgical pathology Differential Diagnosis: R/o BCC  Check Margins: No 1.1cm pink plaque   Excoriation Left Forearm  Start mupirocin and bandage daily. Call for changes. Recheck on follow up  mupirocin ointment (BACTROBAN) 2 % - Left Forearm Apply 1 application topically daily.  AK (actinic keratosis) Right  helix  Prior to procedure, discussed risks of blister formation, small wound, skin dyspigmentation, or rare scar following cryotherapy. Recommend Vaseline ointment to treated areas while healing.    Destruction of lesion - Right helix  Destruction method: cryotherapy   Informed consent: discussed and consent obtained   Lesion destroyed using liquid nitrogen: Yes   Cryotherapy cycles:  2 Outcome: patient tolerated procedure well with no complications   Post-procedure details: wound care instructions given    Actinic Damage - chronic, secondary to cumulative UV radiation exposure/sun exposure over time - diffuse scaly erythematous macules with underlying dyspigmentation - Recommend daily broad spectrum sunscreen SPF 30+ to sun-exposed areas, reapply every 2 hours as needed.  - Recommend staying in the shade or wearing long sleeves, sun glasses (UVA+UVB protection) and wide brim hats (4-inch brim around the entire circumference of the hat). - Call for new or changing lesions.  Lentigines - Scattered tan macules - Due to sun exposure - Benign-appering, observe - Recommend daily broad spectrum sunscreen SPF 30+ to sun-exposed areas, reapply every 2 hours as needed. - Call for any changes  Return for TBSE 6-8 weeks.  Graciella Belton, RMA, am acting as scribe for Forest Gleason, MD .   Documentation: I have reviewed the above documentation for accuracy and completeness, and I agree with the above.  Forest Gleason, MD

## 2021-05-05 ENCOUNTER — Encounter: Payer: Self-pay | Admitting: Dermatology

## 2021-05-05 ENCOUNTER — Other Ambulatory Visit: Payer: Self-pay

## 2021-05-05 ENCOUNTER — Ambulatory Visit: Payer: PPO | Admitting: Dermatology

## 2021-05-05 DIAGNOSIS — C44311 Basal cell carcinoma of skin of nose: Secondary | ICD-10-CM | POA: Diagnosis not present

## 2021-05-05 DIAGNOSIS — C44619 Basal cell carcinoma of skin of left upper limb, including shoulder: Secondary | ICD-10-CM

## 2021-05-05 DIAGNOSIS — C44519 Basal cell carcinoma of skin of other part of trunk: Secondary | ICD-10-CM

## 2021-05-05 NOTE — Progress Notes (Addendum)
   Follow-Up Visit   Subjective  Andrew Mccarthy is a 70 y.o. male who presents for the following: Suture / Staple Removal (Patient here for follow up on biopsy from patient's nose, shoulder and chest).  The following portions of the chart were reviewed this encounter and updated as appropriate:  Tobacco  Allergies  Meds  Problems  Med Hx  Surg Hx  Fam Hx         Objective  Well appearing patient in no apparent distress; mood and affect are within normal limits.  A focused examination was performed including nose. Relevant physical exam findings are noted in the Assessment and Plan.  Left Shoulder - Anterior Not examined today  nasal tip Pink papule  mid chest Not examined today  Assessment & Plan  Basal cell carcinoma (BCC) of skin of left upper extremity including shoulder Left Shoulder - Anterior  Biopsy proven BCC nodular pattern, >2 cm  Spoke with patient at length regarding option of Mohs surgery versus ED&C.  Discussed higher cure rate with Mohs surgery (approximately 98 to 99%).  Discussed cure rate approximately 85% with ED&C.  Advised that if the skin cancer recurs it may have grown deeper and wider.  Discussed no particular activity restrictions after ED&C but he will need to perform wound care for several weeks and it will leave a large white scar.  With Mohs surgery advised he would have a wound that is sutured closed.  Patient prefers ED&C, will schedule  Basal cell carcinoma (BCC) of skin of nose nasal tip  Biopsy proven BCC nodular and infiltrative patterns   Recommend Mohs surgery - patient would like to go to [facility]  Dr. Shelda Jakes at [facility] referral to Dermatology  Basal cell carcinoma (BCC) of chest mid chest  Biopsy proven BCC nodular pattern   Spoke with patient at length regarding option of excision versus ED&C.  Discussed higher cure rate with surgery (approximately 92) but slightly higher risk of  bleeding, infection or area opening up. Also discussed down time (no lifting, restricted activities) after excision.  Discussed cure rate approximately 85% with ED&C.  Advised that if the skin cancer recurs it may have grown deeper and wider.  Discussed no particular activity restrictions after ED&C but he will need to perform wound care for several weeks and it will leave a round white scar.    Patient prefers ED&C, will schedule Encounter for Removal of Sutures - Incision site at the nasal tip is clean, dry and intact - Wound cleansed, sutures removed, wound cleansed and steri strips applied.  - Discussed pathology results showing BASAL CELL CARCINOMA, NODULAR AND INFILTRATIVE PATTERNS, PERIPHERAL MARGIN INVOLVED - Patient advised to keep steri-strips dry until they fall off. - Scars remodel for a full year. - Once steri-strips fall off, patient can apply over-the-counter silicone scar cream each night to help with scar remodeling if desired. - Patient advised to call with any concerns or if they notice any new or changing lesions.  Return for within  in next 8 weeks (20 min visit) ed&c. I, Ruthell Rummage, CMA, am acting as scribe for Forest Gleason, MD.  Documentation: I have reviewed the above documentation for accuracy and completeness, and I agree with the above.  Forest Gleason, MD

## 2021-05-05 NOTE — Addendum Note (Signed)
Addended by: Ruthell Rummage A on: 05/05/2021 10:21 AM   Modules accepted: Orders

## 2021-05-05 NOTE — Patient Instructions (Addendum)
Scars remodel for a full year. - Once steri-strips fall off, patient can apply over-the-counter silicone scar cream each night to help with scar remodeling if desired.  call with any concerns or if they notice any new or changing lesion  Recommend daily broad spectrum sunscreen SPF 30+ to sun-exposed areas, reapply every 2 hours as needed. Call for new or changing lesions.  Staying in the shade or wearing long sleeves, sun glasses (UVA+UVB protection) and wide brim hats (4-inch brim around the entire circumference of the hat) are also recommended for sun protection.    If you have any questions or concerns for your doctor, please call our main line at [phone] and press option 4 to reach your doctor's medical assistant. If no one answers, please leave a voicemail as directed and we will return your call as soon as possible. Messages left after 4 pm will be answered the following business day.   You may also send [place] a message via [place]. We typically respond to MyChart messages within 1-2 business days.  For prescription refills, please ask your pharmacy to contact our office. Our fax number is [phone].  If you have an urgent issue when the clinic is closed that cannot wait until the next business day, you can page your doctor at the number below.    Please note that while we do our best to be available for urgent issues outside of office hours, we are not available 24/7.   If you have an urgent issue and are unable to reach [place], you may choose to seek medical care at your doctor's office, retail clinic, urgent care center, or emergency room.  If you have a medical emergency, please immediately call 911 or go to the emergency department.  Pager Numbers  - Dr. Nehemiah Massed: [phone]  - Dr. Laurence Ferrari: [phone]  - Dr. Nicole Kindred: [phone]  In the event of inclement weather, please call our main line at [phone] for an update on the status of any delays or  closures.  Dermatology Medication Tips: Please keep the boxes that topical medications come in in order to help keep track of the instructions about where and how to use these. Pharmacies typically print the medication instructions only on the boxes and not directly on the medication tubes.   If your medication is too expensive, please contact our office at [phone] option 4 or send [place] a message through [place].   We are unable to tell what your co-pay for medications will be in advance as this is different depending on your insurance coverage. However, we may be able to find a substitute medication at lower cost or fill out paperwork to get insurance to cover a needed medication.   If a prior authorization is required to get your medication covered by your insurance company, please allow [place] 1-2 business days to complete this process.  Drug prices often vary depending on where the prescription is filled and some pharmacies may offer cheaper prices.  The website www.goodrx.com contains coupons for medications through different pharmacies. The prices here do not account for what the cost may be with help from insurance (it may be cheaper with your insurance), but the website can give you the price if you did not use any insurance.  - You can print the associated coupon and take it with your prescription to the pharmacy.  - You may also stop by our office during regular business hours and pick up a GoodRx coupon card.  - If  you need your prescription sent electronically to a different pharmacy, notify our office through [facility] or by phone at [phone] option 4.

## 2021-06-22 ENCOUNTER — Encounter: Payer: Self-pay | Admitting: Dermatology

## 2021-06-22 ENCOUNTER — Ambulatory Visit: Payer: PPO | Admitting: Dermatology

## 2021-06-22 ENCOUNTER — Other Ambulatory Visit: Payer: Self-pay

## 2021-06-22 DIAGNOSIS — C44619 Basal cell carcinoma of skin of left upper limb, including shoulder: Secondary | ICD-10-CM

## 2021-06-22 DIAGNOSIS — T148XXA Other injury of unspecified body region, initial encounter: Secondary | ICD-10-CM

## 2021-06-22 DIAGNOSIS — C44519 Basal cell carcinoma of skin of other part of trunk: Secondary | ICD-10-CM | POA: Diagnosis not present

## 2021-06-22 DIAGNOSIS — C4491 Basal cell carcinoma of skin, unspecified: Secondary | ICD-10-CM

## 2021-06-22 DIAGNOSIS — S20314A Abrasion of middle front wall of thorax, initial encounter: Secondary | ICD-10-CM

## 2021-06-22 DIAGNOSIS — D492 Neoplasm of unspecified behavior of bone, soft tissue, and skin: Secondary | ICD-10-CM

## 2021-06-22 HISTORY — DX: Basal cell carcinoma of skin, unspecified: C44.91

## 2021-06-22 MED ORDER — MUPIROCIN 2 % EX OINT: TOPICAL_OINTMENT | CUTANEOUS | 0 refills | Status: DC

## 2021-06-22 NOTE — Progress Notes (Signed)
Follow-Up Visit   Subjective  Andrew Mccarthy is a 70 y.o. male who presents for the following: Skin Cancer (Pt here for treatment of biopsy proven BCC nodular pattern 05/05/21 at left anterior shoulder and mid chest ). Pt scheduled for Mohs surgery for biopsy proven BASAL CELL CARCINOMA, NODULAR AND INFILTRATIVE PATTERN at the nasal tip 07/21/21 at [facility].   Check a pink spot on the left lateral arm that will not go away.    The following portions of the chart were reviewed this encounter and updated as appropriate:   Tobacco  Allergies  Meds  Problems  Med Hx  Surg Hx  Fam Hx      Review of Systems:  No other skin or systemic complaints except as noted in HPI or Assessment and Plan.  Objective  Well appearing patient in no apparent distress; mood and affect are within normal limits.  A focused examination was performed including shoulder,chest. Relevant physical exam findings are noted in the Assessment and Plan.  Left Shoulder - Anterior Pink pearly papule or plaque with arborizing vessels.   mid chest Pink pearly papule or plaque with arborizing vessels.   left lateral shoulder 1.0 cm pink plaque         Assessment & Plan  Basal cell carcinoma (BCC) of skin of left upper extremity including shoulder Left Shoulder - Anterior  Destruction of lesion  Destruction method: electrodesiccation and curettage   Informed consent: discussed and consent obtained   Timeout:  patient name, date of birth, surgical site, and procedure verified Anesthesia: the lesion was anesthetized in a standard fashion   Anesthetic:  1% lidocaine w/ epinephrine 1-100,000 buffered w/ 8.4% NaHCO3 Curettage performed in three different directions: Yes   Electrodesiccation performed over the curetted area: Yes   Curettage cycles:  3 Final wound size (cm):  2.2 Hemostasis achieved with:  electrodesiccation Outcome: patient tolerated procedure well with no complications   Post-procedure details:  sterile dressing applied and wound care instructions given   Dressing type: petrolatum    Biopsy proven BASAL CELL CARCINOMA, NODULAR PATTERN  Spoke with patient at length regarding option of Mohs surgery versus ED&C.  Discussed higher cure rate with Mohs surgery (approximately 98 to 99%).  Discussed cure rate approximately 85% with ED&C.  Advised that if the skin cancer recurs it may have grown deeper and wider.  Discussed no particular activity restrictions after ED&C but he will need to perform wound care for several weeks and it will leave a large white scar.  With Mohs surgery advised he would have a wound that is sutured closed.   Patient prefers ED&C  Related Medications mupirocin ointment (BACTROBAN) 2 % Apply to skin qd-bid  Basal cell carcinoma (BCC) of skin of other part of torso mid chest  Destruction of lesion  Destruction method: electrodesiccation and curettage   Informed consent: discussed and consent obtained   Timeout:  patient name, date of birth, surgical site, and procedure verified Anesthesia: the lesion was anesthetized in a standard fashion   Anesthetic:  1% lidocaine w/ epinephrine 1-100,000 buffered w/ 8.4% NaHCO3 Curettage performed in three different directions: Yes   Electrodesiccation performed over the curetted area: Yes   Curettage cycles:  3 Final wound size (cm):  1.4 Hemostasis achieved with:  electrodesiccation Outcome: patient tolerated procedure well with no complications   Post-procedure details: sterile dressing applied and wound care instructions given   Dressing type: petrolatum    Biopsy proven BASAL CELL CARCINOMA, NODULAR  PATTERN  Discussed excision vs ED&C. Pt prefers ED&C.  Related Medications mupirocin ointment (BACTROBAN) 2 % Apply to skin qd-bid  Excoriation  Related Medications mupirocin ointment (BACTROBAN) 2 % Apply 1 application topically daily.  Neoplasm of skin left lateral shoulder  Epidermal / dermal  shaving  Lesion diameter (cm):  1 Informed consent: discussed and consent obtained   Timeout: patient name, date of birth, surgical site, and procedure verified   Procedure prep:  Patient was prepped and draped in usual sterile fashion Prep type:  Isopropyl alcohol Anesthesia: the lesion was anesthetized in a standard fashion   Anesthetic:  1% lidocaine w/ epinephrine 1-100,000 buffered w/ 8.4% NaHCO3 Hemostasis achieved with: pressure, aluminum chloride and electrodesiccation   Outcome: patient tolerated procedure well   Post-procedure details: sterile dressing applied and wound care instructions given   Dressing type: bandage and petrolatum    Destruction of lesion  Destruction method: electrodesiccation and curettage   Informed consent: discussed and consent obtained   Timeout:  patient name, date of birth, surgical site, and procedure verified Anesthesia: the lesion was anesthetized in a standard fashion   Anesthetic:  1% lidocaine w/ epinephrine 1-100,000 buffered w/ 8.4% NaHCO3 Curettage performed in three different directions: Yes   Electrodesiccation performed over the curetted area: Yes   Curettage cycles:  3 Final wound size (cm):  1.4 Hemostasis achieved with:  electrodesiccation Outcome: patient tolerated procedure well with no complications   Post-procedure details: sterile dressing applied and wound care instructions given   Dressing type: petrolatum    Specimen 1 - Surgical pathology Differential Diagnosis: R/O BCC 5 fragments in specimen bottle, please review smaller fragments Check Margins: No  Pt requested ED&C at time of biopsy. Discussed this may not show skin cancer and could leave a larger than necessary scar. Pt voiced understanding.  Return in about 1 month (around 07/23/2021) for TBSE, recheck skin cancers .  I, Marye Round, CMA, am acting as scribe for Forest Gleason, MD .   Documentation: I have reviewed the above documentation for accuracy and  completeness, and I agree with the above.  Forest Gleason, MD

## 2021-06-22 NOTE — Patient Instructions (Addendum)
Wound Care Instructions  Cleanse wound gently with soap and water once a day then pat dry with clean gauze. Apply a thing coat of Mupirocin ointment  Do not pick or remove scabs. Do not remove the yellow or white "healing tissue" from the base of the wound.  Cover the wound with fresh, clean, nonstick gauze and secure with paper tape. You may use Band-Aids in place of gauze and tape if the would is small enough, but would recommend trimming much of the tape off as there is often too much. Sometimes Band-Aids can irritate the skin.  You should call the office for your biopsy report after 1 week if you have not already been contacted.  If you experience any problems, such as abnormal amounts of bleeding, swelling, significant bruising, significant pain, or evidence of infection, please call the office immediately.  FOR ADULT SURGERY PATIENTS: If you need something for pain relief you may take 1 extra strength Tylenol (acetaminophen) AND 2 Ibuprofen ('200mg'$  each) together every 4 hours as needed for pain. (do not take these if you are allergic to them or if you have a reason you should not take them.) Typically, you may only need pain medication for 1 to 3 days.     If you have any questions or concerns for your doctor, please call our main line at [phone] and press option 4 to reach your doctor's medical assistant. If no one answers, please leave a voicemail as directed and we will return your call as soon as possible. Messages left after 4 pm will be answered the following business day.   You may also send [place] a message via [place]. We typically respond to MyChart messages within 1-2 business days.  For prescription refills, please ask your pharmacy to contact our office. Our fax number is [phone].  If you have an urgent issue when the clinic is closed that cannot wait until the next business day, you can page your doctor at the number below.    Please note that while we do our best to  be available for urgent issues outside of office hours, we are not available 24/7.   If you have an urgent issue and are unable to reach [place], you may choose to seek medical care at your doctor's office, retail clinic, urgent care center, or emergency room.  If you have a medical emergency, please immediately call 911 or go to the emergency department.  Pager Numbers  - Dr. Nehemiah Massed: [phone]  - Dr. Laurence Ferrari: [phone]  - Dr. Nicole Kindred: [phone]  In the event of inclement weather, please call our main line at [phone] for an update on the status of any delays or closures.  Dermatology Medication Tips: Please keep the boxes that topical medications come in in order to help keep track of the instructions about where and how to use these. Pharmacies typically print the medication instructions only on the boxes and not directly on the medication tubes.   If your medication is too expensive, please contact our office at [phone] option 4 or send [place] a message through [place].   We are unable to tell what your co-pay for medications will be in advance as this is different depending on your insurance coverage. However, we may be able to find a substitute medication at lower cost or fill out paperwork to get insurance to cover a needed medication.   If a prior authorization is required to get your medication covered by your insurance company, please allow  [place] 1-2 business days to complete this process.  Drug prices often vary depending on where the prescription is filled and some pharmacies may offer cheaper prices.  The website www.goodrx.com contains coupons for medications through different pharmacies. The prices here do not account for what the cost may be with help from insurance (it may be cheaper with your insurance), but the website can give you the price if you did not use any insurance.  - You can print the associated coupon and take it with your prescription to the pharmacy.   - You may also stop by our office during regular business hours and pick up a GoodRx coupon card.  - If you need your prescription sent electronically to a different pharmacy, notify our office through [facility] or by phone at [phone] option 4.

## 2021-06-28 ENCOUNTER — Encounter: Payer: Self-pay | Admitting: Dermatology

## 2021-06-30 ENCOUNTER — Telehealth: Payer: Self-pay

## 2021-06-30 DIAGNOSIS — C44619 Basal cell carcinoma of skin of left upper limb, including shoulder: Secondary | ICD-10-CM

## 2021-06-30 NOTE — Telephone Encounter (Addendum)
Patient called and notified of biopsy results at left lateral shoulder. He states since he is already scheduled for Mohs surgery at nose, he would like to have mohs done on this area as well. He would like to request if treatment can be done at same time and date as surgery scheduled for nose.     ----- Message from Alfonso Patten, MD sent at 06/29/2021  8:42 PM EDT ----- Skin , left lateral shoulder BASAL CELL CARCINOMA, NODULAR AND INFILTRATIVE PATTERNS --> already treated with ED&C. However, this particular type of basal cell skin cancer has an infiltrative pattern "roots" which can make it at higher risk of coming back and growing deeper.  We could consider Mohs surgery (highest cure rate 98-99%) or an excision (92-93% cure) in the office to help ensure the roots are gone so this is less likely to come back and grow deeper.  Of the two, Mohs is done off site but is a smaller surgery site. Alternatively, we can monitor the area and if it comes back, would send for Mohs surgery.  MAs please call and let Dr. Laurence Ferrari know if patient has questions. Thank you!

## 2021-07-21 DIAGNOSIS — L578 Other skin changes due to chronic exposure to nonionizing radiation: Secondary | ICD-10-CM | POA: Diagnosis not present

## 2021-07-21 DIAGNOSIS — C44311 Basal cell carcinoma of skin of nose: Secondary | ICD-10-CM | POA: Diagnosis not present

## 2021-07-21 DIAGNOSIS — Z85828 Personal history of other malignant neoplasm of skin: Secondary | ICD-10-CM | POA: Diagnosis not present

## 2021-07-21 DIAGNOSIS — C4491 Basal cell carcinoma of skin, unspecified: Secondary | ICD-10-CM

## 2021-07-21 DIAGNOSIS — C44619 Basal cell carcinoma of skin of left upper limb, including shoulder: Secondary | ICD-10-CM | POA: Diagnosis not present

## 2021-07-21 DIAGNOSIS — L814 Other melanin hyperpigmentation: Secondary | ICD-10-CM | POA: Diagnosis not present

## 2021-07-21 HISTORY — DX: Basal cell carcinoma of skin, unspecified: C44.91

## 2021-08-04 ENCOUNTER — Encounter: Payer: Self-pay | Admitting: Dermatology

## 2021-08-04 ENCOUNTER — Other Ambulatory Visit: Payer: Self-pay

## 2021-08-04 ENCOUNTER — Ambulatory Visit: Payer: PPO | Admitting: Dermatology

## 2021-08-04 DIAGNOSIS — L814 Other melanin hyperpigmentation: Secondary | ICD-10-CM

## 2021-08-04 DIAGNOSIS — L821 Other seborrheic keratosis: Secondary | ICD-10-CM | POA: Diagnosis not present

## 2021-08-04 DIAGNOSIS — C44519 Basal cell carcinoma of skin of other part of trunk: Secondary | ICD-10-CM | POA: Diagnosis not present

## 2021-08-04 DIAGNOSIS — D229 Melanocytic nevi, unspecified: Secondary | ICD-10-CM | POA: Diagnosis not present

## 2021-08-04 DIAGNOSIS — T8130XA Disruption of wound, unspecified, initial encounter: Secondary | ICD-10-CM | POA: Diagnosis not present

## 2021-08-04 DIAGNOSIS — L905 Scar conditions and fibrosis of skin: Secondary | ICD-10-CM | POA: Diagnosis not present

## 2021-08-04 DIAGNOSIS — L578 Other skin changes due to chronic exposure to nonionizing radiation: Secondary | ICD-10-CM

## 2021-08-04 DIAGNOSIS — Z1283 Encounter for screening for malignant neoplasm of skin: Secondary | ICD-10-CM

## 2021-08-04 DIAGNOSIS — D1800 Hemangioma unspecified site: Secondary | ICD-10-CM

## 2021-08-04 DIAGNOSIS — Z85828 Personal history of other malignant neoplasm of skin: Secondary | ICD-10-CM | POA: Diagnosis not present

## 2021-08-04 DIAGNOSIS — L570 Actinic keratosis: Secondary | ICD-10-CM

## 2021-08-04 DIAGNOSIS — D489 Neoplasm of uncertain behavior, unspecified: Secondary | ICD-10-CM

## 2021-08-04 MED ORDER — MUPIROCIN 2 % EX OINT: 1.0000 "application " | TOPICAL_OINTMENT | Freq: Three times a day (TID) | CUTANEOUS | 0 refills | Status: DC

## 2021-08-04 MED ORDER — MUPIROCIN 2 % EX OINT: 1.0000 "application " | TOPICAL_OINTMENT | Freq: Three times a day (TID) | CUTANEOUS | 1 refills | Status: AC

## 2021-08-04 NOTE — Patient Instructions (Addendum)
Biopsy Wound Care Instructions  Leave the original bandage on for 24 hours if possible.  If the bandage becomes soaked or soiled before that time, it is OK to remove it and examine the wound.  A small amount of post-operative bleeding is normal.  If excessive bleeding occurs, remove the bandage, place gauze over the site and apply continuous pressure (no peeking) over the area for 30 minutes. If this does not work, please call our clinic as soon as possible or page your doctor if it is after hours.   Once a day, cleanse the wound with soap and water. It is fine to shower. If a thick crust develops you may use a Q-tip dipped into dilute hydrogen peroxide (mix 1:1 with water) to dissolve it.  Hydrogen peroxide can slow the healing process, so use it only as needed.    After washing, apply petroleum jelly (Vaseline) or an antibiotic ointment if your doctor prescribed one for you, followed by a bandage.    For best healing, the wound should be covered with a layer of ointment at all times. If you are not able to keep the area covered with a bandage to hold the ointment in place, this may mean re-applying the ointment several times a day.  Continue this wound care until the wound has healed and is no longer open.   Itching and mild discomfort is normal during the healing process. However, if you develop pain or severe itching, please call our office.   If you have any discomfort, you can take Tylenol (acetaminophen) or ibuprofen as directed on the bottle. (Please do not take these if you have an allergy to them or cannot take them for another reason).  Some redness, tenderness and white or yellow material in the wound is normal healing.  If the area becomes very sore and red, or develops a thick yellow-green material (pus), it may be infected; please notify us.    If you have stitches, return to clinic as directed to have the stitches removed. You will continue wound care for 2-3 days after the stitches  are removed.   Wound healing continues for up to one year following surgery. It is not unusual to experience pain in the scar from time to time during the interval.  If the pain becomes severe or the scar thickens, you should notify the office.    A slight amount of redness in a scar is expected for the first six months.  After six months, the redness will fade and the scar will soften and fade.  The color difference becomes less noticeable with time.  If there are any problems, return for a post-op surgery check at your earliest convenience.  To improve the appearance of the scar, you can use silicone scar gel, cream, or sheets (such as Mederma or Serica) every night for up to one year. These are available over the counter (without a prescription).  Please call our office at [phone] for any questions or concerns.         Recommend taking Heliocare sun protection supplement daily in sunny weather for additional sun protection. For maximum protection on the sunniest days, you can take up to 2 capsules of regular Heliocare OR take 1 capsule of Heliocare Ultra. For prolonged exposure (such as a full day in the sun), you can repeat your dose of the supplement 4 hours after your first dose. Heliocare can be purchased at [facility] or at [email or web address].  Melanoma ABCDEs  Melanoma is the most dangerous type of skin cancer, and is the leading cause of death from skin disease.  You are more likely to develop melanoma if you: Have light-colored skin, light-colored eyes, or red or blond hair Spend a lot of time in the sun Tan regularly, either outdoors or in a tanning bed Have had blistering sunburns, especially during childhood Have a close family member who has had a melanoma Have atypical moles or large birthmarks  Early detection of melanoma is key since treatment is typically straightforward and cure rates are extremely high if we catch it early.   The first sign of  melanoma is often a change in a mole or a new dark spot.  The ABCDE system is a way of remembering the signs of melanoma.  A for asymmetry:  The two halves do not match. B for border:  The edges of the growth are irregular. C for color:  A mixture of colors are present instead of an even brown color. D for diameter:  Melanomas are usually (but not always) greater than 49mm - the size of a pencil eraser. E for evolution:  The spot keeps changing in size, shape, and color.  Please check your skin once per month between visits. You can use a small mirror in front and a large mirror behind you to keep an eye on the back side or your body.   If you see any new or changing lesions before your next follow-up, please call to schedule a visit.  Please continue daily skin protection including broad spectrum sunscreen SPF 30+ to sun-exposed areas, reapplying every 2 hours as needed when you're outdoors.   Staying in the shade or wearing long sleeves, sun glasses (UVA+UVB protection) and wide brim hats (4-inch brim around the entire circumference of the hat) are also recommended for sun protection.    If you have any questions or concerns for your doctor, please call our main line at [phone] and press option 4 to reach your doctor's medical assistant. If no one answers, please leave a voicemail as directed and we will return your call as soon as possible. Messages left after 4 pm will be answered the following business day.   You may also send [place] a message via [place]. We typically respond to MyChart messages within 1-2 business days.  For prescription refills, please ask your pharmacy to contact our office. Our fax number is [phone].  If you have an urgent issue when the clinic is closed that cannot wait until the next business day, you can page your doctor at the number below.    Please note that while we do our best to be available for urgent issues outside of office hours, we are not  available 24/7.   If you have an urgent issue and are unable to reach [place], you may choose to seek medical care at your doctor's office, retail clinic, urgent care center, or emergency room.  If you have a medical emergency, please immediately call 911 or go to the emergency department.  Pager Numbers  - Dr. Nehemiah Massed: [phone]  - Dr. Laurence Ferrari: [phone]  - Dr. Nicole Kindred: [phone]  In the event of inclement weather, please call our main line at [phone] for an update on the status of any delays or closures.  Dermatology Medication Tips: Please keep the boxes that topical medications come in in order to help keep track of the instructions about where and how to use these. Pharmacies  typically print the medication instructions only on the boxes and not directly on the medication tubes.   If your medication is too expensive, please contact our office at [phone] option 4 or send [place] a message through [place].   We are unable to tell what your co-pay for medications will be in advance as this is different depending on your insurance coverage. However, we may be able to find a substitute medication at lower cost or fill out paperwork to get insurance to cover a needed medication.   If a prior authorization is required to get your medication covered by your insurance company, please allow [place] 1-2 business days to complete this process.  Drug prices often vary depending on where the prescription is filled and some pharmacies may offer cheaper prices.  The website www.goodrx.com contains coupons for medications through different pharmacies. The prices here do not account for what the cost may be with help from insurance (it may be cheaper with your insurance), but the website can give you the price if you did not use any insurance.  - You can print the associated coupon and take it with your prescription to the pharmacy.  - You may also stop by our office during regular business hours  and pick up a GoodRx coupon card.  - If you need your prescription sent electronically to a different pharmacy, notify our office through [facility] or by phone at [phone] option 4.

## 2021-08-04 NOTE — Progress Notes (Signed)
Follow-Up Visit   Subjective  Andrew Mccarthy is a 70 y.o. male who presents for the following: Follow-up (Patient here today tbse. Patient reports having mohs done on nose and does not feel areas is healing properly.  Patient reports no new concerns. 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 ).  Patient here for full body skin exam and skin cancer screening.  The following portions of the chart were reviewed this encounter and updated as appropriate:  Tobacco  Allergies  Meds  Problems  Med Hx  Surg Hx  Fam Hx      Review of Systems: No other skin or systemic complaints except as noted in HPI or Assessment and Plan.   Objective  Well appearing patient in no apparent distress; mood and affect are within normal limits.  A full examination was performed including scalp, head, eyes, ears, nose, lips, neck, chest, axillae, abdomen, back, buttocks, bilateral upper extremities, bilateral lower extremities, hands, feet, fingers, toes, fingernails, and toenails. All findings within normal limits unless otherwise noted below.  nasal tip Dehisced wound with yellow central crusting   left forearm x 1, right posterior shoulder x 1, right helix x 1 (3) Erythematous thin papules/macules with gritty scale.   Right Upper Back 1.23 cm eroded pink plaque        left shoulder With moderate amount of yellow crusting   Assessment & Plan  Wound dehiscence nasal tip  Culture done today to r/o infection, sent to [facility]  Will reach out to Dr. Billy Fischer at [facility] [place] regarding follow-up plan and possible scar revision plan  Start Mupirocin 2% ointment - apply to affected areas topically 3 times daily apply to affected area of nose and left shoulder.   Patient denies any pain at location.    mupirocin ointment (BACTROBAN) 2 % - nasal tip Apply 1 application topically 3 (three) times daily. Apply to affected area of nose and left shoulder. Also can apply to area on  back  Anaerobic and Aerobic Culture - nasal tip  Actinic keratosis (3) left forearm x 1, right posterior shoulder x 1, right helix x 1  Actinic keratoses are precancerous spots that appear secondary to cumulative UV radiation exposure/sun exposure over time. They are chronic with expected duration over 1 year. A portion of actinic keratoses will progress to squamous cell carcinoma of the skin. It is not possible to reliably predict which spots will progress to skin cancer and so treatment is recommended to prevent development of skin cancer.  Recommend daily broad spectrum sunscreen SPF 30+ to sun-exposed areas, reapply every 2 hours as needed.  Recommend staying in the shade or wearing long sleeves, sun glasses (UVA+UVB protection) and wide brim hats (4-inch brim around the entire circumference of the hat). Call for new or changing lesions.  Prior to procedure, discussed risks of blister formation, small wound, skin dyspigmentation, or rare scar following cryotherapy. Recommend Vaseline ointment to treated areas while healing.   Destruction of lesion - left forearm x 1, right posterior shoulder x 1, right helix x 1  Destruction method: cryotherapy   Informed consent: discussed and consent obtained   Lesion destroyed using liquid nitrogen: Yes   Cryotherapy cycles:  2 Outcome: patient tolerated procedure well with no complications   Post-procedure details: wound care instructions given   Additional details:  Prior to procedure, discussed risks of blister formation, small wound, skin dyspigmentation, or rare scar following cryotherapy. Recommend Vaseline ointment to treated areas  while healing.   Neoplasm of uncertain behavior Right Upper Back  Skin / nail biopsy Type of biopsy: tangential   Informed consent: discussed and consent obtained   Patient was prepped and draped in usual sterile fashion: Area prepped with alcohol. Anesthesia: the lesion was anesthetized in a standard  fashion   Anesthetic:  1% lidocaine w/ epinephrine 1-100,000 buffered w/ 8.4% NaHCO3 Instrument used: flexible razor blade   Hemostasis achieved with: pressure, aluminum chloride and electrodesiccation   Outcome: patient tolerated procedure well   Post-procedure details: wound care instructions given   Post-procedure details comment:  Ointment and small bandage applied  Specimen 1 - Surgical pathology Differential Diagnosis: r/o bcc vs amelanotic   Check Margins: No  R/o bcc vs amelanotic   Healing scar left shoulder  Healing surgical site.  Recommend apply mupirocin to affected area daily until healed.  Lentigines - Scattered tan macules - Due to sun exposure - Benign-appearing, observe - Recommend daily broad spectrum sunscreen SPF 30+ to sun-exposed areas, reapply every 2 hours as needed. - Call for any changes  Seborrheic Keratoses - Stuck-on, waxy, tan-brown papules and/or plaques  - Benign-appearing - Discussed benign etiology and prognosis. - Observe - Call for any changes  Melanocytic Nevi - Tan-brown and/or pink-flesh-colored symmetric macules and papules - Benign appearing on exam today - Observation - Call clinic for new or changing moles - Recommend daily use of broad spectrum spf 30+ sunscreen to sun-exposed areas.   Hemangiomas - Red papules - Discussed benign nature - Observe - Call for any changes  Actinic Damage - Chronic condition, secondary to cumulative UV/sun exposure - diffuse scaly erythematous macules with underlying dyspigmentation - Recommend daily broad spectrum sunscreen SPF 30+ to sun-exposed areas, reapply every 2 hours as needed.  - Staying in the shade or wearing long sleeves, sun glasses (UVA+UVB protection) and wide brim hats (4-inch brim around the entire circumference of the hat) are also recommended for sun protection.  - Call for new or changing lesions.  History of Basal Cell Carcinoma of the Skin - No evidence of  recurrence today - Recommend regular full body skin exams - Recommend daily broad spectrum sunscreen SPF 30+ to sun-exposed areas, reapply every 2 hours as needed.  - Call if any new or changing lesions are noted between office visits  Skin cancer screening performed today.  Return for 6 month tbse . I, Ruthell Rummage, CMA, am acting as scribe for Forest Gleason, MD.  Documentation: I have reviewed the above documentation for accuracy and completeness, and I agree with the above.  Forest Gleason, MD

## 2021-08-10 ENCOUNTER — Telehealth: Payer: Self-pay

## 2021-08-10 NOTE — Telephone Encounter (Signed)
-----   Message from [place], MD sent at 08/10/2021  2:51 PM EDT ----- Skin , right upper back BASAL CELL CARCINOMA, NODULAR PATTERN, CRUSTED --> ED&C (pt has preferred this to excision or Mohs in the past).  MAs please call. Thank you!

## 2021-08-11 ENCOUNTER — Telehealth: Payer: Self-pay

## 2021-08-11 NOTE — Telephone Encounter (Signed)
-----   Message from [place], MD sent at 08/10/2021  2:51 PM EDT ----- Skin , right upper back BASAL CELL CARCINOMA, NODULAR PATTERN, CRUSTED --> ED&C (pt has preferred this to excision or Mohs in the past).  MAs please call. Thank you!

## 2021-08-15 LAB — ANAEROBIC AND AEROBIC CULTURE

## 2021-08-17 ENCOUNTER — Telehealth: Payer: Self-pay

## 2021-08-17 NOTE — Telephone Encounter (Signed)
-----   Message from Alfonso Patten, MD sent at 08/17/2021  1:56 PM EDT ----- Culture at nose showed heavy growth normal skin bacteria but no infection. Recommend continuing mupirocin 3 times per day or can switch back to vaseline 3 times per day to keep area moist while healing.  MAs please call. Thank you!

## 2021-08-17 NOTE — Telephone Encounter (Signed)
Patient advised culture at nose showed heavy growth normal skin bacteria but no infection. Recommend continuing mupirocin 3 times per day or can switch back to vaseline 3 times per day to keep area moist while healing.

## 2021-08-23 DIAGNOSIS — Z85828 Personal history of other malignant neoplasm of skin: Secondary | ICD-10-CM | POA: Diagnosis not present

## 2021-08-23 DIAGNOSIS — L905 Scar conditions and fibrosis of skin: Secondary | ICD-10-CM | POA: Diagnosis not present

## 2021-08-24 DIAGNOSIS — J849 Interstitial pulmonary disease, unspecified: Secondary | ICD-10-CM | POA: Diagnosis not present

## 2021-08-24 DIAGNOSIS — Z125 Encounter for screening for malignant neoplasm of prostate: Secondary | ICD-10-CM | POA: Diagnosis not present

## 2021-08-24 DIAGNOSIS — J431 Panlobular emphysema: Secondary | ICD-10-CM | POA: Diagnosis not present

## 2021-08-24 DIAGNOSIS — E782 Mixed hyperlipidemia: Secondary | ICD-10-CM | POA: Diagnosis not present

## 2021-09-07 ENCOUNTER — Ambulatory Visit: Payer: PPO | Admitting: Dermatology

## 2021-10-11 DIAGNOSIS — Z85828 Personal history of other malignant neoplasm of skin: Secondary | ICD-10-CM | POA: Diagnosis not present

## 2021-10-11 DIAGNOSIS — L905 Scar conditions and fibrosis of skin: Secondary | ICD-10-CM | POA: Diagnosis not present

## 2021-10-14 ENCOUNTER — Other Ambulatory Visit: Payer: Self-pay

## 2021-10-14 ENCOUNTER — Ambulatory Visit: Payer: PPO | Admitting: Dermatology

## 2021-10-14 DIAGNOSIS — C44519 Basal cell carcinoma of skin of other part of trunk: Secondary | ICD-10-CM

## 2021-10-14 DIAGNOSIS — D2322 Other benign neoplasm of skin of left ear and external auricular canal: Secondary | ICD-10-CM | POA: Diagnosis not present

## 2021-10-14 DIAGNOSIS — L570 Actinic keratosis: Secondary | ICD-10-CM | POA: Diagnosis not present

## 2021-10-14 DIAGNOSIS — L578 Other skin changes due to chronic exposure to nonionizing radiation: Secondary | ICD-10-CM

## 2021-10-14 DIAGNOSIS — Z85828 Personal history of other malignant neoplasm of skin: Secondary | ICD-10-CM

## 2021-10-14 NOTE — Progress Notes (Signed)
Follow-Up Visit   Subjective  Andrew Mccarthy is a 70 y.o. male who presents for the following: biopsy proven BCC  (At the right upper back - patient is here today for treatment with [facility]). He has also noticed a crusted tender nodule on his left ear that he would like checked today. He states that it has been there for a while, but has been really bothersome the past two months. AK follow up today R ear, R forearm, and R post shoulder. Check for recurrence today. Recheck nasal tip today bx proven BCC S/P MOHS. He was seen by Dr. Billy Fischer on 10/11/21 who performed dermabrasion to the irregularly bumpy scar.    The following portions of the chart were reviewed this encounter and updated as appropriate:   Tobacco  Allergies  Meds  Problems  Med Hx  Surg Hx  Fam Hx      Review of Systems:  No other skin or systemic complaints except as noted in HPI or Assessment and Plan.  Objective  Well appearing patient in no apparent distress; mood and affect are within normal limits.  A focused examination was performed including the face, ears, arms, back. Relevant physical exam findings are noted in the Assessment and Plan.  R mid back Pink plaque  L antihelix x 1 Smooth pink papule with central crusting overlying most prominent cartilage   R helix x 1 Erythematous thin papules/macules with gritty scale.   Nasal tip Healing wound.   Assessment & Plan  Basal cell carcinoma (BCC) of skin of other part of torso R mid back  Destruction of lesion Complexity: extensive   Destruction method: electrodesiccation and curettage   Informed consent: discussed and consent obtained   Timeout:  patient name, date of birth, surgical site, and procedure verified Procedure prep:  Patient was prepped and draped in usual sterile fashion Prep type:  Isopropyl alcohol Anesthesia: the lesion was anesthetized in a standard fashion   Anesthetic:  1% lidocaine w/ epinephrine 1-100,000 buffered w/ 8.4%  NaHCO3 Curettage performed in three different directions: Yes   Electrodesiccation performed over the curetted area: Yes   Final wound size (cm):  1.8 Hemostasis achieved with:  pressure, aluminum chloride and electrodesiccation Outcome: patient tolerated procedure well with no complications   Post-procedure details: sterile dressing applied and wound care instructions given   Dressing type: bandage and petrolatum    Benign neoplasm of skin of left ear L antihelix x 1  C/w chondrodermatitis nodularis helicis symptomatic  Avoid sleeping on this side or use donut pillow or softer pillow to offload pressure from this year  RTC if lesion doesn't resolve, improve, or if it resolves then recurs.   Destruction of lesion - L antihelix x 1 Complexity: simple   Destruction method: cryotherapy   Informed consent: discussed and consent obtained   Timeout:  patient name, date of birth, surgical site, and procedure verified Lesion destroyed using liquid nitrogen: Yes   Region frozen until ice ball extended beyond lesion: Yes   Outcome: patient tolerated procedure well with no complications   Post-procedure details: wound care instructions given    AK (actinic keratosis) R helix x 1  Prior to procedure, discussed risks of blister formation, small wound, skin dyspigmentation, or rare scar following cryotherapy. Recommend Vaseline ointment to treated areas while healing.  Actinic keratoses are precancerous spots that appear secondary to cumulative UV radiation exposure/sun exposure over time. They are chronic with expected duration over 1 year. A portion of  actinic keratoses will progress to squamous cell carcinoma of the skin. It is not possible to reliably predict which spots will progress to skin cancer and so treatment is recommended to prevent development of skin cancer.  Recommend daily broad spectrum sunscreen SPF 30+ to sun-exposed areas, reapply every 2 hours as needed.  Recommend  staying in the shade or wearing long sleeves, sun glasses (UVA+UVB protection) and wide brim hats (4-inch brim around the entire circumference of the hat). Call for new or changing lesions.   Destruction of lesion - R helix x 1 Complexity: simple   Destruction method: cryotherapy   Informed consent: discussed and consent obtained   Timeout:  patient name, date of birth, surgical site, and procedure verified Lesion destroyed using liquid nitrogen: Yes   Region frozen until ice ball extended beyond lesion: Yes   Outcome: patient tolerated procedure well with no complications   Post-procedure details: wound care instructions given    History of basal cell carcinoma (BCC) Nasal tip  S/P dermabrasion from Dr. Billy Fischer. No evidence of recurrence. Scar improving after dermabrasion. Recommend RTC with Dr. Billy Fischer if patient not satisfied with results after dermabrasion.  Actinic Damage - chronic, secondary to cumulative UV radiation exposure/sun exposure over time - diffuse scaly erythematous macules with underlying dyspigmentation - Recommend daily broad spectrum sunscreen SPF 30+ to sun-exposed areas, reapply every 2 hours as needed.  - Recommend staying in the shade or wearing long sleeves, sun glasses (UVA+UVB protection) and wide brim hats (4-inch brim around the entire circumference of the hat). - Call for new or changing lesions.  Return in about 3 months (around 01/12/2022) for TBSE.  Andrew Mccarthy, CMA, am acting as scribe for Forest Gleason, MD .  Documentation: I have reviewed the above documentation for accuracy and completeness, and I agree with the above.  Forest Gleason, MD

## 2021-10-14 NOTE — Patient Instructions (Addendum)
Recommend Niacinamide or Nicotinamide 500mg  twice per day to lower risk of non-melanoma skin cancer by approximately 25%. This is usually available at [employer].   Electrodesiccation and Curettage ("Scrape and Burn") Wound Care Instructions  Leave the original bandage on for 24 hours if possible.  If the bandage becomes soaked or soiled before that time, it is OK to remove it and examine the wound.  A small amount of post-operative bleeding is normal.  If excessive bleeding occurs, remove the bandage, place gauze over the site and apply continuous pressure (no peeking) over the area for 30 minutes. If this does not work, please call our clinic as soon as possible or page your doctor if it is after hours.   Once a day, cleanse the wound with soap and water. It is fine to shower. If a thick crust develops you may use a Q-tip dipped into dilute hydrogen peroxide (mix 1:1 with water) to dissolve it.  Hydrogen peroxide can slow the healing process, so use it only as needed.    After washing, apply petroleum jelly (Vaseline) or an antibiotic ointment if your doctor prescribed one for you, followed by a bandage.    For best healing, the wound should be covered with a layer of ointment at all times. If you are not able to keep the area covered with a bandage to hold the ointment in place, this may mean re-applying the ointment several times a day.  Continue this wound care until the wound has healed and is no longer open. It may take several weeks for the wound to heal and close.  Itching and mild discomfort is normal during the healing process.  If you have any discomfort, you can take Tylenol (acetaminophen) or ibuprofen as directed on the bottle. (Please do not take these if you have an allergy to them or cannot take them for another reason).  Some redness, tenderness and white or yellow material in the wound is normal healing.  If the area becomes very sore and red, or develops a thick yellow-green  material (pus), it may be infected; please notify us.    Wound healing continues for up to one year following surgery. It is not unusual to experience pain in the scar from time to time during the interval.  If the pain becomes severe or the scar thickens, you should notify the office.    A slight amount of redness in a scar is expected for the first six months.  After six months, the redness will fade and the scar will soften and fade.  The color difference becomes less noticeable with time.  If there are any problems, return for a post-op surgery check at your earliest convenience.  To improve the appearance of the scar, you can use silicone scar gel, cream, or sheets (such as Mederma or Serica) every night for up to one year. These are available over the counter (without a prescription).  Please call our office at [phone] for any questions or concerns.

## 2021-10-21 ENCOUNTER — Encounter: Payer: Self-pay | Admitting: Dermatology

## 2021-10-28 DIAGNOSIS — I2510 Atherosclerotic heart disease of native coronary artery without angina pectoris: Secondary | ICD-10-CM | POA: Diagnosis not present

## 2021-10-28 DIAGNOSIS — E782 Mixed hyperlipidemia: Secondary | ICD-10-CM | POA: Diagnosis not present

## 2021-10-28 DIAGNOSIS — I2584 Coronary atherosclerosis due to calcified coronary lesion: Secondary | ICD-10-CM | POA: Diagnosis not present

## 2021-10-28 DIAGNOSIS — I700 Atherosclerosis of aorta: Secondary | ICD-10-CM | POA: Diagnosis not present

## 2021-12-21 ENCOUNTER — Other Ambulatory Visit: Payer: Self-pay

## 2021-12-21 DIAGNOSIS — Z87891 Personal history of nicotine dependence: Secondary | ICD-10-CM

## 2021-12-21 DIAGNOSIS — H8112 Benign paroxysmal vertigo, left ear: Secondary | ICD-10-CM | POA: Diagnosis not present

## 2021-12-27 DIAGNOSIS — J431 Panlobular emphysema: Secondary | ICD-10-CM | POA: Diagnosis not present

## 2022-01-12 ENCOUNTER — Ambulatory Visit: Payer: PPO | Admitting: Dermatology

## 2022-01-31 ENCOUNTER — Ambulatory Visit
Admission: RE | Admit: 2022-01-31 | Discharge: 2022-01-31 | Disposition: A | Discharge status: Home / Self Care | Payer: PPO | Source: Ambulatory Visit | Attending: Acute Care | Admitting: Acute Care

## 2022-01-31 ENCOUNTER — Other Ambulatory Visit: Payer: Self-pay

## 2022-01-31 DIAGNOSIS — Z87891 Personal history of nicotine dependence: Secondary | ICD-10-CM | POA: Diagnosis not present

## 2022-02-02 ENCOUNTER — Ambulatory Visit: Payer: PPO | Admitting: Dermatology

## 2022-02-02 ENCOUNTER — Other Ambulatory Visit: Payer: Self-pay

## 2022-02-02 DIAGNOSIS — R42 Dizziness and giddiness: Secondary | ICD-10-CM | POA: Diagnosis not present

## 2022-02-02 DIAGNOSIS — Z87891 Personal history of nicotine dependence: Secondary | ICD-10-CM

## 2022-03-08 ENCOUNTER — Ambulatory Visit: Payer: PPO | Admitting: Dermatology

## 2022-06-22 DIAGNOSIS — Z125 Encounter for screening for malignant neoplasm of prostate: Secondary | ICD-10-CM | POA: Diagnosis not present

## 2022-06-22 DIAGNOSIS — J849 Interstitial pulmonary disease, unspecified: Secondary | ICD-10-CM | POA: Diagnosis not present

## 2022-06-22 DIAGNOSIS — E782 Mixed hyperlipidemia: Secondary | ICD-10-CM | POA: Diagnosis not present

## 2022-06-22 DIAGNOSIS — I700 Atherosclerosis of aorta: Secondary | ICD-10-CM | POA: Diagnosis not present

## 2022-06-22 DIAGNOSIS — Z0000 Encounter for general adult medical examination without abnormal findings: Secondary | ICD-10-CM | POA: Diagnosis not present

## 2022-06-22 DIAGNOSIS — E538 Deficiency of other specified B group vitamins: Secondary | ICD-10-CM | POA: Diagnosis not present

## 2022-06-22 DIAGNOSIS — J431 Panlobular emphysema: Secondary | ICD-10-CM | POA: Diagnosis not present

## 2022-09-16 DIAGNOSIS — J45902 Unspecified asthma with status asthmaticus: Secondary | ICD-10-CM | POA: Diagnosis not present

## 2022-09-16 DIAGNOSIS — J40 Bronchitis, not specified as acute or chronic: Secondary | ICD-10-CM | POA: Diagnosis not present

## 2022-09-16 DIAGNOSIS — J441 Chronic obstructive pulmonary disease with (acute) exacerbation: Secondary | ICD-10-CM | POA: Diagnosis not present

## 2022-09-20 DIAGNOSIS — J431 Panlobular emphysema: Secondary | ICD-10-CM | POA: Diagnosis not present

## 2022-09-22 DIAGNOSIS — J441 Chronic obstructive pulmonary disease with (acute) exacerbation: Secondary | ICD-10-CM | POA: Diagnosis not present

## 2022-12-08 DIAGNOSIS — R7309 Other abnormal glucose: Secondary | ICD-10-CM | POA: Diagnosis not present

## 2022-12-08 DIAGNOSIS — I2510 Atherosclerotic heart disease of native coronary artery without angina pectoris: Secondary | ICD-10-CM | POA: Diagnosis not present

## 2022-12-08 DIAGNOSIS — E785 Hyperlipidemia, unspecified: Secondary | ICD-10-CM | POA: Diagnosis not present

## 2022-12-08 DIAGNOSIS — Z87891 Personal history of nicotine dependence: Secondary | ICD-10-CM | POA: Diagnosis not present

## 2022-12-08 DIAGNOSIS — I700 Atherosclerosis of aorta: Secondary | ICD-10-CM | POA: Diagnosis not present

## 2023-02-01 ENCOUNTER — Ambulatory Visit
Admission: RE | Admit: 2023-02-01 | Discharge: 2023-02-01 | Disposition: A | Discharge status: Home / Self Care | Payer: PPO | Source: Ambulatory Visit | Attending: Internal Medicine | Admitting: Internal Medicine

## 2023-02-01 DIAGNOSIS — Z87891 Personal history of nicotine dependence: Secondary | ICD-10-CM

## 2023-02-03 ENCOUNTER — Other Ambulatory Visit: Payer: Self-pay

## 2023-02-03 DIAGNOSIS — Z87891 Personal history of nicotine dependence: Secondary | ICD-10-CM

## 2023-03-03 IMAGING — CT CT ANGIO CHEST
2 of 6 series · 18 of 46 positions shown · IV contrast (APPLIED)
Comparison: Chest CT July 10, 2020 and same day radiograph.

CLINICAL DATA: high prob SOB, 8wk s/p covid, improved now worsening
once again

EXAM:
CT ANGIOGRAPHY CHEST WITH CONTRAST
TECHNIQUE: Multidetector CT imaging of the chest was performed using the
standard protocol during bolus administration of intravenous
contrast. Multiplanar CT image reconstructions and MIPs were
obtained to evaluate the vascular anatomy.
CONTRAST:  100mL OMNIPAQUE IOHEXOL 350 MG/ML SOLN

[Series 5: thins · axial · 0.78mm/px · z∈[-78,+171]mm · 16 of 273 slices shown]
[im 12/273  lung]
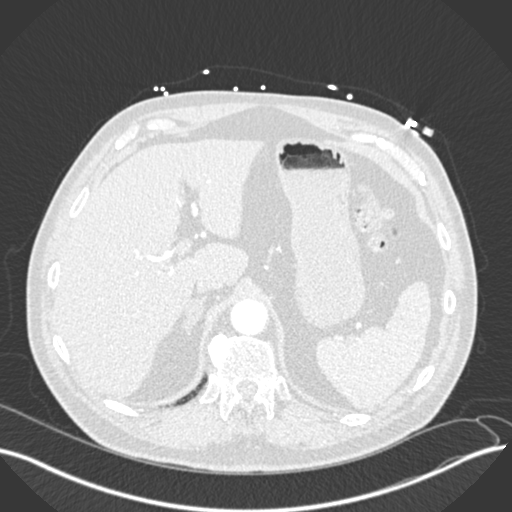
[im 36/273  soft-tissue]
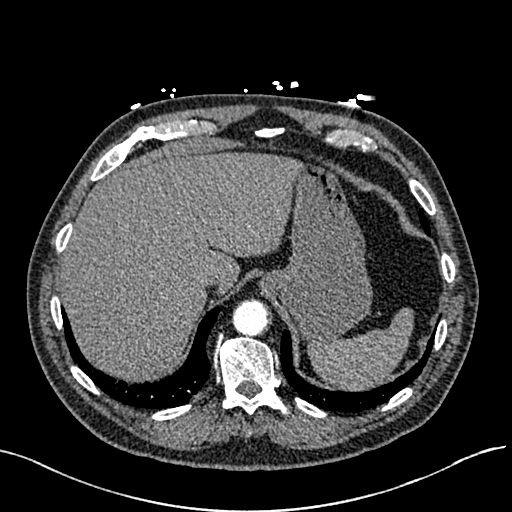
[im 48/273  lung]
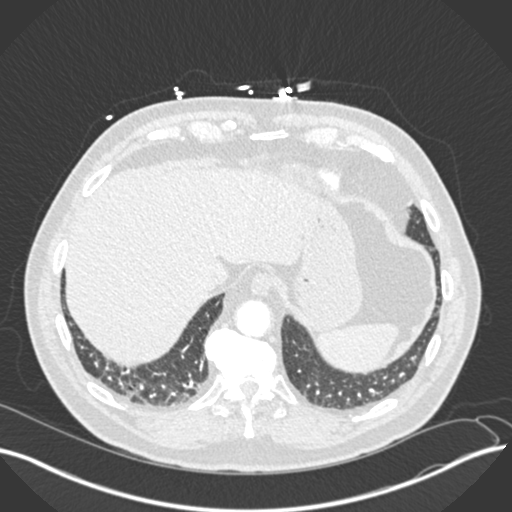
[im 60/273  soft-tissue]
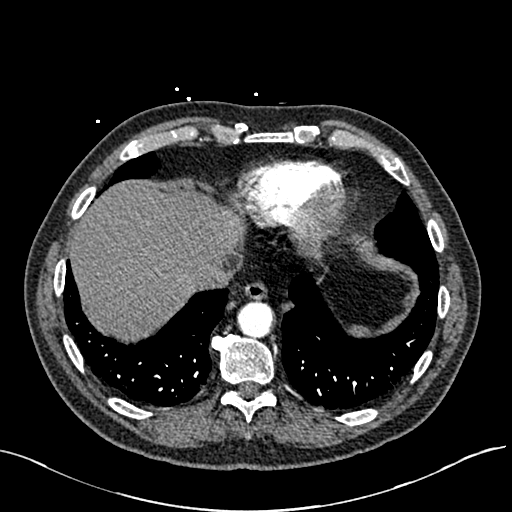
[im 83/273  lung]
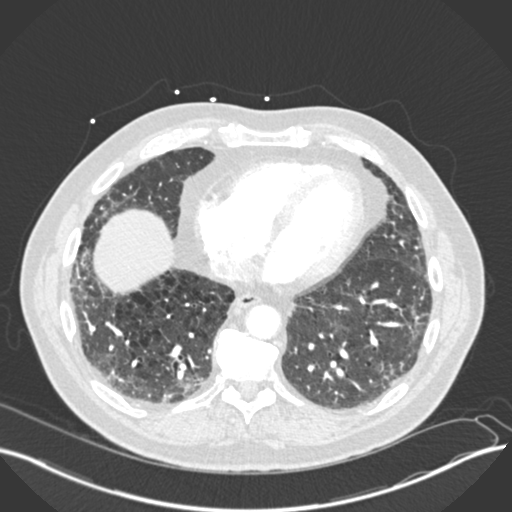
[im 95/273  soft-tissue]
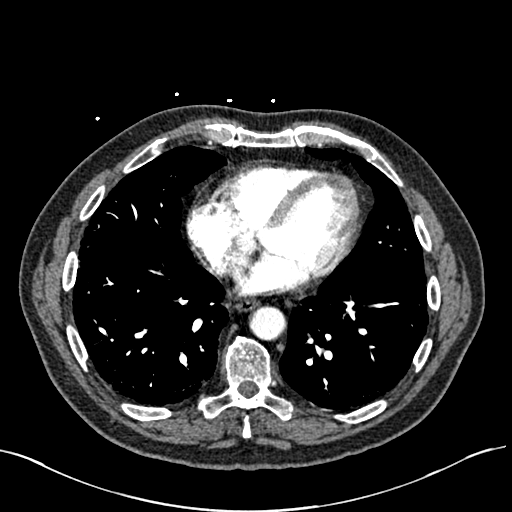
[im 107/273  lung]
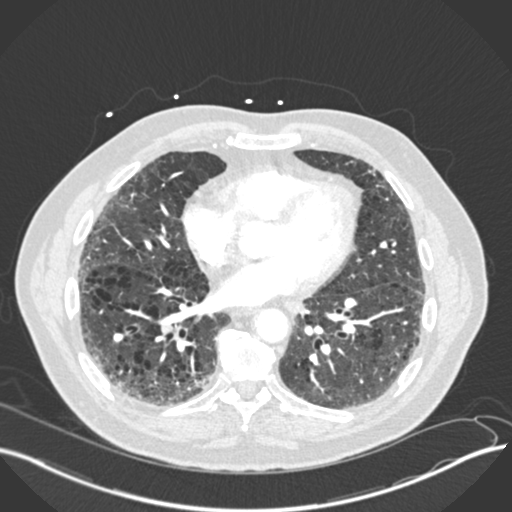
[im 131/273  soft-tissue]
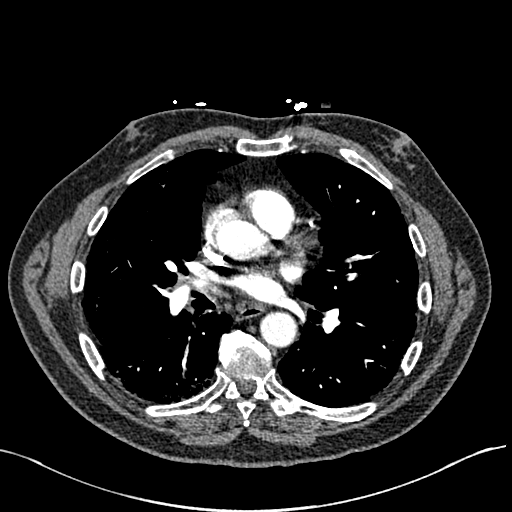
[im 142/273  lung]
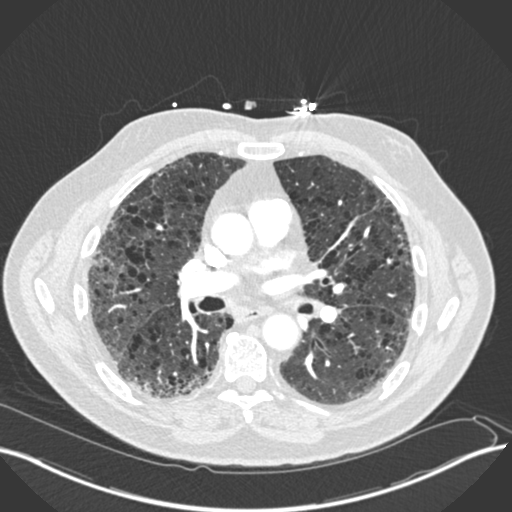
[im 166/273  soft-tissue]
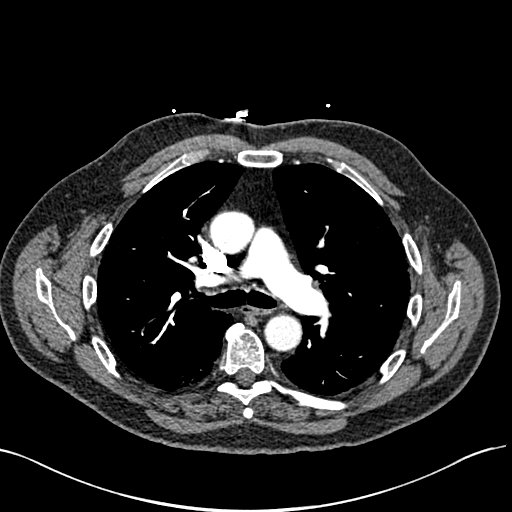
[im 178/273  lung]
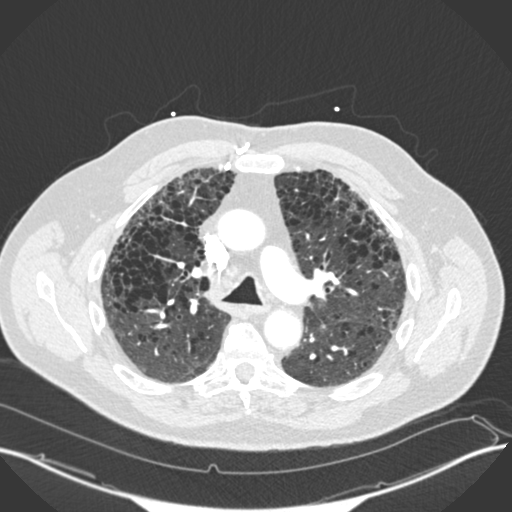
[im 190/273  soft-tissue]
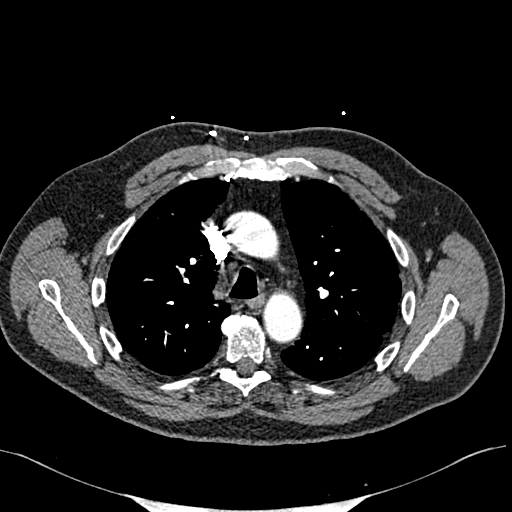
[im 213/273  lung]
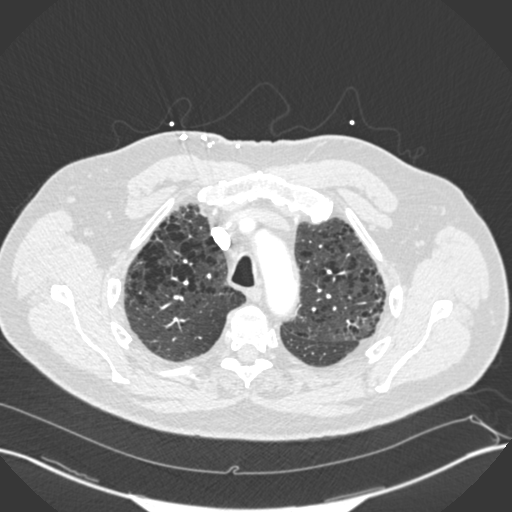
[im 225/273  soft-tissue]
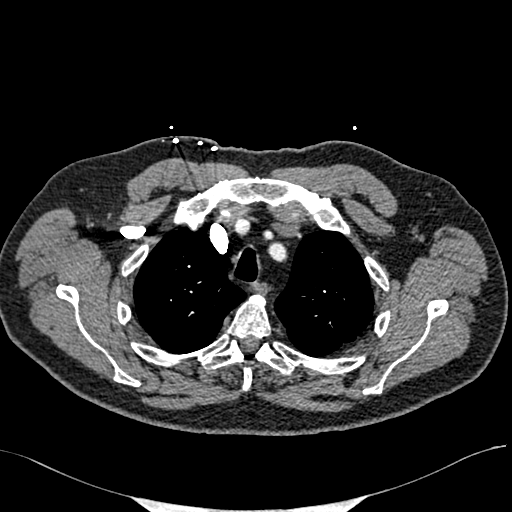
[im 237/273  lung]
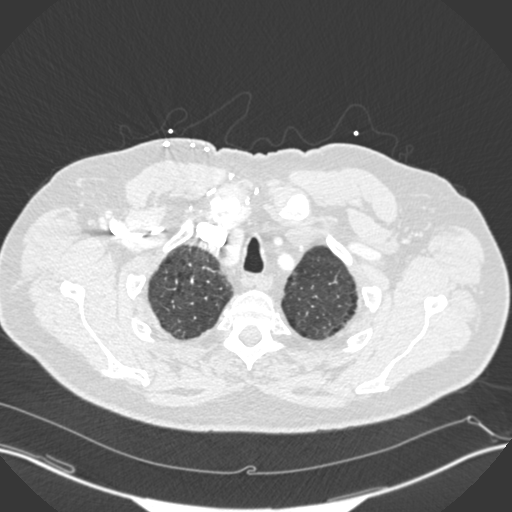
[im 261/273  soft-tissue]
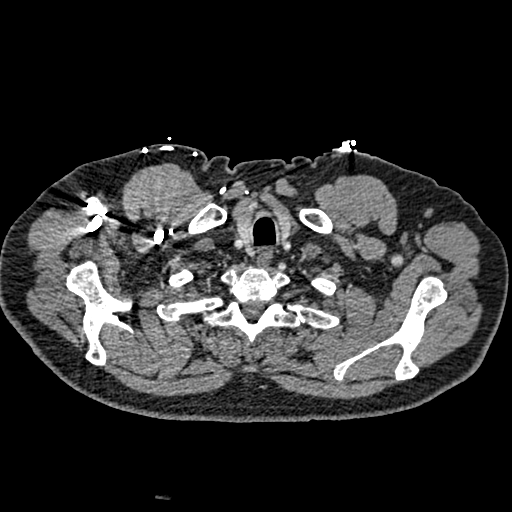

[Series 7: coronal mpr · coronal · 0.54mm/px · 2 of 92 slices shown]
[im 31/92  soft-tissue]
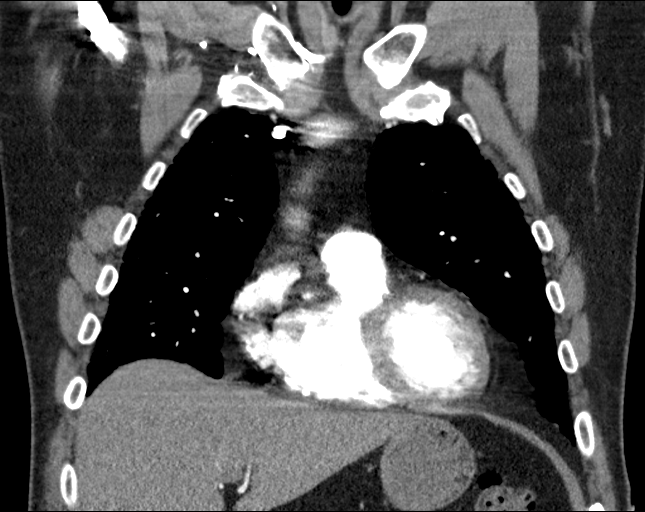
[im 61/92  soft-tissue]
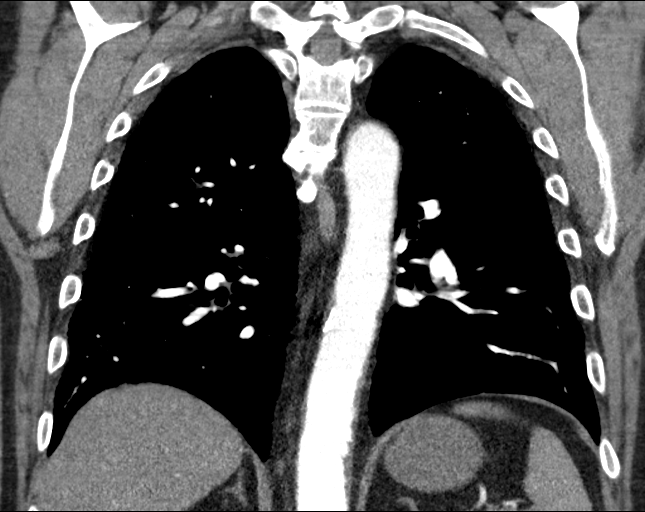

[18 of 46 positions shown; findings below may reference images not displayed]

FINDINGS: Cardiovascular: Satisfactory opacification of the pulmonary arteries
to the segmental level. No evidence of pulmonary embolism. Scattered
thoracic aortic atherosclerosis. No thoracic aortic aneurysm. Normal
heart size. No pericardial effusion. RCA and LAD coronary artery
calcifications.

Mediastinum/Nodes: No enlarged mediastinal, hilar, or axillary lymph
nodes. Thyroid gland, trachea, and esophagus demonstrate no
significant findings.

Lungs/Pleura: Moderate to advanced centrilobular and paraseptal
emphysema. Interval increase in the peripheral subpleural
reticulations. No ground-glass opacities or honeycombing. No focal
consolidation. Tiny scattered pulmonary nodules are again visualized
and appears similar to prior examination. No new suspicious
pulmonary nodules. No pleural effusion. No pneumothorax.

Upper Abdomen: 2.0 cm right adrenal adenoma.  No acute abnormality.

Musculoskeletal: Right-sided bridging anterior osteophytes with
relative preservation of the disc spaces, consistent with diffuse
idiopathic skeletal hyperostosis. Mild degenerative change of the
thoracic spine. No acute osseous abnormality.

Review of the MIP images confirms the above findings.
IMPRESSION: 1. No evidence of pulmonary embolism or acute intrathoracic process.
2. Moderate to advanced centrilobular and paraseptal emphysema with
interval increase in the peripheral subpleural reticulations.
Findings are suggestive of worsening combined pulmonary fibrosis and
emphysema.
3. Emphysema and aortic atherosclerosis.

Aortic Atherosclerosis (UFLFC-ALK.K) and Emphysema (UFLFC-2CC.X).

## 2023-03-30 DIAGNOSIS — M722 Plantar fascial fibromatosis: Secondary | ICD-10-CM | POA: Diagnosis not present

## 2023-04-06 DIAGNOSIS — J449 Chronic obstructive pulmonary disease, unspecified: Secondary | ICD-10-CM | POA: Diagnosis not present

## 2023-04-20 DIAGNOSIS — M722 Plantar fascial fibromatosis: Secondary | ICD-10-CM | POA: Diagnosis not present

## 2023-07-04 DIAGNOSIS — I77811 Abdominal aortic ectasia: Secondary | ICD-10-CM | POA: Diagnosis not present

## 2023-07-04 DIAGNOSIS — Z79899 Other long term (current) drug therapy: Secondary | ICD-10-CM | POA: Diagnosis not present

## 2023-07-04 DIAGNOSIS — E538 Deficiency of other specified B group vitamins: Secondary | ICD-10-CM | POA: Diagnosis not present

## 2023-07-04 DIAGNOSIS — Z125 Encounter for screening for malignant neoplasm of prostate: Secondary | ICD-10-CM | POA: Diagnosis not present

## 2023-07-04 DIAGNOSIS — J431 Panlobular emphysema: Secondary | ICD-10-CM | POA: Diagnosis not present

## 2023-07-04 DIAGNOSIS — R739 Hyperglycemia, unspecified: Secondary | ICD-10-CM | POA: Diagnosis not present

## 2023-07-04 DIAGNOSIS — Z0000 Encounter for general adult medical examination without abnormal findings: Secondary | ICD-10-CM | POA: Diagnosis not present

## 2023-07-04 DIAGNOSIS — D369 Benign neoplasm, unspecified site: Secondary | ICD-10-CM | POA: Diagnosis not present

## 2023-07-05 ENCOUNTER — Other Ambulatory Visit: Payer: Self-pay | Admitting: Internal Medicine

## 2023-07-05 DIAGNOSIS — I77811 Abdominal aortic ectasia: Secondary | ICD-10-CM

## 2023-07-12 ENCOUNTER — Ambulatory Visit
Admission: RE | Admit: 2023-07-12 | Discharge: 2023-07-12 | Disposition: A | Discharge status: Home / Self Care | Payer: PPO | Source: Ambulatory Visit | Attending: Internal Medicine | Admitting: Internal Medicine

## 2023-07-12 DIAGNOSIS — I77811 Abdominal aortic ectasia: Secondary | ICD-10-CM | POA: Insufficient documentation

## 2023-07-12 DIAGNOSIS — Z0000 Encounter for general adult medical examination without abnormal findings: Secondary | ICD-10-CM | POA: Diagnosis not present

## 2023-07-14 DIAGNOSIS — M5410 Radiculopathy, site unspecified: Secondary | ICD-10-CM | POA: Diagnosis not present

## 2023-07-14 DIAGNOSIS — G8929 Other chronic pain: Secondary | ICD-10-CM | POA: Diagnosis not present

## 2023-07-14 DIAGNOSIS — M545 Low back pain, unspecified: Secondary | ICD-10-CM | POA: Diagnosis not present

## 2023-07-14 DIAGNOSIS — S39012A Strain of muscle, fascia and tendon of lower back, initial encounter: Secondary | ICD-10-CM | POA: Diagnosis not present

## 2023-12-08 DIAGNOSIS — Z860101 Personal history of adenomatous and serrated colon polyps: Secondary | ICD-10-CM | POA: Diagnosis not present

## 2023-12-08 DIAGNOSIS — R198 Other specified symptoms and signs involving the digestive system and abdomen: Secondary | ICD-10-CM | POA: Diagnosis not present

## 2023-12-08 DIAGNOSIS — K5790 Diverticulosis of intestine, part unspecified, without perforation or abscess without bleeding: Secondary | ICD-10-CM | POA: Diagnosis not present

## 2024-02-01 ENCOUNTER — Ambulatory Visit
Admission: RE | Admit: 2024-02-01 | Discharge: 2024-02-01 | Disposition: A | Discharge status: Home / Self Care | Source: Ambulatory Visit | Attending: Acute Care | Admitting: Acute Care

## 2024-02-01 DIAGNOSIS — Z87891 Personal history of nicotine dependence: Secondary | ICD-10-CM | POA: Insufficient documentation

## 2024-02-02 ENCOUNTER — Ambulatory Visit: Payer: Self-pay

## 2024-02-02 ENCOUNTER — Ambulatory Visit

## 2024-02-02 DIAGNOSIS — K640 First degree hemorrhoids: Secondary | ICD-10-CM | POA: Diagnosis not present

## 2024-02-02 DIAGNOSIS — D125 Benign neoplasm of sigmoid colon: Secondary | ICD-10-CM | POA: Diagnosis not present

## 2024-02-02 DIAGNOSIS — Z1211 Encounter for screening for malignant neoplasm of colon: Secondary | ICD-10-CM | POA: Diagnosis not present

## 2024-02-02 DIAGNOSIS — Z860100 Personal history of colon polyps, unspecified: Secondary | ICD-10-CM | POA: Diagnosis not present

## 2024-02-02 DIAGNOSIS — D126 Benign neoplasm of colon, unspecified: Secondary | ICD-10-CM | POA: Diagnosis not present

## 2024-02-02 DIAGNOSIS — D123 Benign neoplasm of transverse colon: Secondary | ICD-10-CM | POA: Diagnosis not present

## 2024-02-02 DIAGNOSIS — Z860101 Personal history of adenomatous and serrated colon polyps: Secondary | ICD-10-CM | POA: Diagnosis not present

## 2024-02-02 DIAGNOSIS — K5730 Diverticulosis of large intestine without perforation or abscess without bleeding: Secondary | ICD-10-CM | POA: Diagnosis not present

## 2024-03-04 ENCOUNTER — Other Ambulatory Visit: Payer: Self-pay

## 2024-03-04 DIAGNOSIS — Z87891 Personal history of nicotine dependence: Secondary | ICD-10-CM

## 2024-03-04 DIAGNOSIS — Z122 Encounter for screening for malignant neoplasm of respiratory organs: Secondary | ICD-10-CM

## 2024-04-05 DIAGNOSIS — J449 Chronic obstructive pulmonary disease, unspecified: Secondary | ICD-10-CM | POA: Diagnosis not present

## 2024-04-05 DIAGNOSIS — G4733 Obstructive sleep apnea (adult) (pediatric): Secondary | ICD-10-CM | POA: Diagnosis not present

## 2024-04-05 DIAGNOSIS — J849 Interstitial pulmonary disease, unspecified: Secondary | ICD-10-CM | POA: Diagnosis not present

## 2024-04-10 DIAGNOSIS — J8410 Pulmonary fibrosis, unspecified: Secondary | ICD-10-CM | POA: Diagnosis not present

## 2024-04-10 DIAGNOSIS — J449 Chronic obstructive pulmonary disease, unspecified: Secondary | ICD-10-CM | POA: Diagnosis not present

## 2024-07-02 DIAGNOSIS — R739 Hyperglycemia, unspecified: Secondary | ICD-10-CM | POA: Diagnosis not present

## 2024-07-02 DIAGNOSIS — E782 Mixed hyperlipidemia: Secondary | ICD-10-CM | POA: Diagnosis not present

## 2024-07-02 DIAGNOSIS — Z125 Encounter for screening for malignant neoplasm of prostate: Secondary | ICD-10-CM | POA: Diagnosis not present

## 2024-07-02 DIAGNOSIS — E538 Deficiency of other specified B group vitamins: Secondary | ICD-10-CM | POA: Diagnosis not present

## 2024-07-11 DIAGNOSIS — R739 Hyperglycemia, unspecified: Secondary | ICD-10-CM | POA: Diagnosis not present

## 2024-07-11 DIAGNOSIS — J432 Centrilobular emphysema: Secondary | ICD-10-CM | POA: Diagnosis not present

## 2024-07-11 DIAGNOSIS — Z1331 Encounter for screening for depression: Secondary | ICD-10-CM | POA: Diagnosis not present

## 2024-07-11 DIAGNOSIS — E538 Deficiency of other specified B group vitamins: Secondary | ICD-10-CM | POA: Diagnosis not present

## 2024-07-11 DIAGNOSIS — Z125 Encounter for screening for malignant neoplasm of prostate: Secondary | ICD-10-CM | POA: Diagnosis not present

## 2024-07-11 DIAGNOSIS — Z0000 Encounter for general adult medical examination without abnormal findings: Secondary | ICD-10-CM | POA: Diagnosis not present

## 2024-07-11 DIAGNOSIS — E782 Mixed hyperlipidemia: Secondary | ICD-10-CM | POA: Diagnosis not present

## 2024-07-11 DIAGNOSIS — Z79899 Other long term (current) drug therapy: Secondary | ICD-10-CM | POA: Diagnosis not present

## 2024-08-27 DIAGNOSIS — F17210 Nicotine dependence, cigarettes, uncomplicated: Secondary | ICD-10-CM | POA: Diagnosis not present
